# Patient Record
Sex: Male | Born: 1955 | Race: White | Hispanic: No | Marital: Married | Smoking: Former smoker
Health system: Southern US, Community
[De-identification: ages and names within clinical notes are randomized; demographics above are authoritative.]

## PROBLEM LIST (undated history)

## (undated) DIAGNOSIS — R112 Nausea with vomiting, unspecified: Secondary | ICD-10-CM

## (undated) DIAGNOSIS — K219 Gastro-esophageal reflux disease without esophagitis: Secondary | ICD-10-CM

## (undated) DIAGNOSIS — Z9889 Other specified postprocedural states: Secondary | ICD-10-CM

## (undated) DIAGNOSIS — T8859XA Other complications of anesthesia, initial encounter: Secondary | ICD-10-CM

## (undated) DIAGNOSIS — I509 Heart failure, unspecified: Secondary | ICD-10-CM

## (undated) DIAGNOSIS — T4145XA Adverse effect of unspecified anesthetic, initial encounter: Secondary | ICD-10-CM

## (undated) DIAGNOSIS — I10 Essential (primary) hypertension: Secondary | ICD-10-CM

## (undated) HISTORY — PX: ROTATOR CUFF REPAIR: SHX139

## (undated) HISTORY — PX: KNEE SURGERY: SHX244

## (undated) HISTORY — PX: LAMINECTOMY: SHX219

## (undated) HISTORY — PX: CORONARY ANGIOPLASTY: SHX604

## (undated) HISTORY — PX: OTHER SURGICAL HISTORY: SHX169

---
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
## 1993-11-28 HISTORY — PX: CERVICAL DISCECTOMY: SHX98

## 1998-04-29 ENCOUNTER — Encounter: Admission: RE | Admit: 1998-04-29 | Discharge: 1998-07-28 | Payer: Self-pay | Admitting: Anesthesiology

## 1998-06-02 ENCOUNTER — Inpatient Hospital Stay (HOSPITAL_COMMUNITY): Admission: EM | Admit: 1998-06-02 | Discharge: 1998-06-03 | Payer: Self-pay | Admitting: Emergency Medicine

## 1998-11-28 HISTORY — PX: SHOULDER ARTHROSCOPY W/ ROTATOR CUFF REPAIR: SHX2400

## 1999-08-24 ENCOUNTER — Ambulatory Visit (HOSPITAL_COMMUNITY): Admission: RE | Admit: 1999-08-24 | Discharge: 1999-08-24 | Payer: Self-pay | Admitting: Orthopedic Surgery

## 1999-08-24 ENCOUNTER — Encounter: Payer: Self-pay | Admitting: Orthopedic Surgery

## 2000-04-26 ENCOUNTER — Ambulatory Visit (HOSPITAL_COMMUNITY): Admission: RE | Admit: 2000-04-26 | Discharge: 2000-04-26 | Payer: Self-pay | Admitting: Neurosurgery

## 2000-04-26 ENCOUNTER — Encounter: Payer: Self-pay | Admitting: Neurosurgery

## 2000-06-08 ENCOUNTER — Inpatient Hospital Stay (HOSPITAL_COMMUNITY): Admission: RE | Admit: 2000-06-08 | Discharge: 2000-06-09 | Payer: Self-pay | Admitting: Neurosurgery

## 2000-06-08 ENCOUNTER — Encounter: Payer: Self-pay | Admitting: Neurosurgery

## 2000-11-28 HISTORY — PX: LAMINECTOMY: SHX219

## 2003-07-23 ENCOUNTER — Encounter: Payer: Self-pay | Admitting: Geriatric Medicine

## 2003-07-23 ENCOUNTER — Encounter: Admission: RE | Admit: 2003-07-23 | Discharge: 2003-07-23 | Payer: Self-pay | Admitting: Geriatric Medicine

## 2003-09-20 ENCOUNTER — Encounter: Payer: Self-pay | Admitting: Geriatric Medicine

## 2003-09-20 ENCOUNTER — Encounter: Admission: RE | Admit: 2003-09-20 | Discharge: 2003-09-20 | Payer: Self-pay | Admitting: Geriatric Medicine

## 2006-02-25 ENCOUNTER — Encounter: Admission: RE | Admit: 2006-02-25 | Discharge: 2006-02-25 | Payer: Self-pay | Admitting: Neurosurgery

## 2010-03-12 ENCOUNTER — Ambulatory Visit (HOSPITAL_COMMUNITY): Admission: RE | Admit: 2010-03-12 | Discharge: 2010-03-12 | Payer: Self-pay | Admitting: Interventional Cardiology

## 2010-08-16 ENCOUNTER — Encounter
Admission: RE | Admit: 2010-08-16 | Discharge: 2010-09-27 | Payer: Self-pay | Source: Home / Self Care | Admitting: Orthopaedic Surgery

## 2011-02-22 ENCOUNTER — Ambulatory Visit: Payer: No Typology Code available for payment source | Attending: Orthopedic Surgery | Admitting: Physical Therapy

## 2011-02-22 DIAGNOSIS — M25673 Stiffness of unspecified ankle, not elsewhere classified: Secondary | ICD-10-CM | POA: Insufficient documentation

## 2011-02-22 DIAGNOSIS — M25579 Pain in unspecified ankle and joints of unspecified foot: Secondary | ICD-10-CM | POA: Insufficient documentation

## 2011-02-22 DIAGNOSIS — M25676 Stiffness of unspecified foot, not elsewhere classified: Secondary | ICD-10-CM | POA: Insufficient documentation

## 2011-02-22 DIAGNOSIS — IMO0001 Reserved for inherently not codable concepts without codable children: Secondary | ICD-10-CM | POA: Insufficient documentation

## 2011-03-01 ENCOUNTER — Ambulatory Visit: Payer: No Typology Code available for payment source | Attending: Orthopedic Surgery | Admitting: Physical Therapy

## 2011-03-01 DIAGNOSIS — M25673 Stiffness of unspecified ankle, not elsewhere classified: Secondary | ICD-10-CM | POA: Insufficient documentation

## 2011-03-01 DIAGNOSIS — M25579 Pain in unspecified ankle and joints of unspecified foot: Secondary | ICD-10-CM | POA: Insufficient documentation

## 2011-03-01 DIAGNOSIS — IMO0001 Reserved for inherently not codable concepts without codable children: Secondary | ICD-10-CM | POA: Insufficient documentation

## 2011-03-01 DIAGNOSIS — M25676 Stiffness of unspecified foot, not elsewhere classified: Secondary | ICD-10-CM | POA: Insufficient documentation

## 2011-03-03 ENCOUNTER — Ambulatory Visit: Payer: No Typology Code available for payment source | Admitting: Rehabilitation

## 2011-03-07 ENCOUNTER — Ambulatory Visit: Payer: No Typology Code available for payment source | Admitting: Rehabilitation

## 2011-03-09 ENCOUNTER — Ambulatory Visit: Payer: No Typology Code available for payment source | Admitting: Rehabilitation

## 2011-03-15 ENCOUNTER — Ambulatory Visit: Payer: No Typology Code available for payment source | Admitting: Physical Therapy

## 2011-03-17 ENCOUNTER — Ambulatory Visit: Payer: No Typology Code available for payment source | Admitting: Rehabilitation

## 2011-03-21 ENCOUNTER — Ambulatory Visit: Payer: No Typology Code available for payment source | Admitting: Physical Therapy

## 2011-03-23 ENCOUNTER — Ambulatory Visit: Payer: No Typology Code available for payment source | Admitting: Physical Therapy

## 2011-03-28 ENCOUNTER — Ambulatory Visit: Payer: No Typology Code available for payment source | Admitting: Rehabilitation

## 2011-03-31 ENCOUNTER — Encounter: Payer: No Typology Code available for payment source | Admitting: Rehabilitation

## 2011-04-01 ENCOUNTER — Ambulatory Visit: Payer: No Typology Code available for payment source | Attending: Orthopedic Surgery | Admitting: Rehabilitation

## 2011-04-01 DIAGNOSIS — IMO0001 Reserved for inherently not codable concepts without codable children: Secondary | ICD-10-CM | POA: Insufficient documentation

## 2011-04-01 DIAGNOSIS — M25676 Stiffness of unspecified foot, not elsewhere classified: Secondary | ICD-10-CM | POA: Insufficient documentation

## 2011-04-01 DIAGNOSIS — M25579 Pain in unspecified ankle and joints of unspecified foot: Secondary | ICD-10-CM | POA: Insufficient documentation

## 2011-04-01 DIAGNOSIS — M25673 Stiffness of unspecified ankle, not elsewhere classified: Secondary | ICD-10-CM | POA: Insufficient documentation

## 2011-04-12 ENCOUNTER — Ambulatory Visit: Payer: No Typology Code available for payment source | Admitting: Rehabilitation

## 2011-04-14 ENCOUNTER — Ambulatory Visit: Payer: No Typology Code available for payment source | Admitting: Rehabilitation

## 2011-04-15 NOTE — H&P (Signed)
[place]. [facility]  Patient:    Joshua Raymond, Joshua Raymond                       MRN: [id number] Adm. Date:  [id number] Attending:  Barton Fanny CC:         Hewitt Shorts, M.D.                         History and Physical  CHIEF COMPLAINT: The patient is a 55 year old right-handed white male admitted for treatment of right lumbar radiculopathy.  HISTORY OF PRESENT ILLNESS: He has been having a variety of difficulties with his low back over the past several years and in May 1999 he had presented with pain and numbness in his distal anterior left thigh and around his left knee. He was treated by an orthopedist with a series of three epidural steroid injections and the pain improved, but he continues to have numbness in the distal anterior left thigh.  He did well until June 2000 when he re-injured his low back but this time with pain into his low back and into the right buttock.  Subsequently the pain last summer trended down to the right lower extremity to the posterior thigh and lateral leg and he found it painful to sit, lay down, or walk, and he had some pain even extending into the right lower abdomen.  He again tried epidural steroid injection but the first injection caused intensification of his pain and was severely disabling for several days, but he did not pursue any further epidural steroid injections. He was then treated with medications including hydrocodone, Neurontin, and Vioxx.  Surgery was proposed but toward the end of last year the pain eased. However, this past winter and spring, in January and March 2001, he had two recurrences of several radicular pain into the right buttock, posterior thigh, and lateral leg, and because of this recurring pain and discomfort the patient has pursued further evaluation.  He still complains of pain in the right buttock, posterior thigh, and lateral leg.  He finds that he has to sleep in a recliner or in  a fetal position.  He is unable to sleep on his back.  He finds that minimal physical activity can aggravate the pain.  He denies any numbness or paresthesias in the right lower extremity.  He does still have numbness in the distal left anterior thigh.  He does not describe any weakness but he says when he has severe radicular pain the use of the right lower extremity is limited because of the severity of the pain.  At this point he is using Vioxx 25 mg q.d.  Notably, he does has a history of right peroneal nerve injury suffered in a football game.  He had a bad right knee injury associated with the peroneal nerve injury and has residual weakness of his right foot.  He does not feel there is any new weakness in the right foot.  PAST MEDICAL HISTORY:  1. History of hypertension treated for the past ten years or more.  2. History of sleep apnea, for which he has been prescribed a nasal BiPAP     unit but he really does not find it helps and he does not tolerate it     well.  There is no history of myocardial infarction, cancer, stroke, diabetes, peptic ulcer disease, or lung disease.  PAST SURGICAL HISTORY:  1. In  1973 the patient underwent repair of torn ligaments in the right knee     with associated right peroneal nerve injury and a C5-6.  2. Anterior cervical diskectomy and fusion in 1994 by Dr. Danielle Dess.  3. Right rotator cuff repair in 1998.  ALLERGIES: No known drug allergies.  CURRENT MEDICATIONS:  1. Vioxx 25 mg q.d.  2. Hyzaar 50/12.5 mg 1 tablet p.o. q.d. for hypertension.  FAMILY HISTORY: His father died at age 73 of heart attack.  His mother died at age 55 of bone cancer.  There is a family history of hypertension.  SOCIAL HISTORY: The patient is married and works as a [employer]ar[employer] manager; however, he has had to leave his full-time job and is now working on a consulting basis because he has not been able to keep up with the work because of the discomfort and pain  limiting his ability to sit.  He has two daughters. He does not smoke.  He drinks alcoholic beverages socially.  He denies history of substance abuse.  REVIEW OF SYSTEMS: Notable for those findings described in the History of Present Illness and past medical history, but otherwise unremarkable.  PHYSICAL EXAMINATION:  GENERAL: The patient is a well-developed, well-nourished white male in no acute distress.  VITAL SIGNS: Temperature 98.6 degrees, pulse 86, blood pressure 127/80, respiratory rate 18.  Height 6 feet.  Weight 270 pounds.  LUNGS: Clear to auscultation.  He has symmetrical respiratory excursion.  HEART: Regular rate and rhythm, normal S1 and S2 with no murmur.  ABDOMEN: Soft, nondistended.  Bowel sounds present.  EXTREMITIES: No clubbing, cyanosis, or edema.  MUSCULOSKELETAL: No tenderness to palpation over the lumbar spinous process or paralumbar musculature.  He is able to flex 90 degrees and able to extend well.  There is no significant discomfort on flexion or extension.  Straight leg raising is negative on the left and on the right at about 80-90 degrees he gets some discomfort into the right buttock.  NEUROLOGIC: Strength 5/5 to left lower extremity for the iliopsoas, quadriceps, dorsiflexion, plantar flexion, and extensor hallucis longus. However, in the right lower extremity there is significant distal weakness at the right iliopsoas and quadriceps are 5, dorsiflexion is 2/5, the invertor is 3/5, the evertor is 3/5, the extensor hallucis longus is 1/5, and plantar flexion is 4+/5.  Sensory examination shows decreased sensation to pinprick to the distal right lower extremity as compared to the distal left lower extremity.  However, the most significant diminished sensation to pinprick is in the lateral aspirin of the right leg and foot.  Reflexes are 2 at the quadriceps and gastrocnemius and they are symmetric bilaterally.  Toes are downgoing bilaterally.  He  has normal gait and stance.  LABORATORY DATA: MRI scan from May 1999 and August 2000 both done at [facility] were  reviewed and the May 1999 study is notable for degenerative changes at L4-5 and L5-S1, which appeared similar to that in August 2000; however, in May 1999 he had a large disk herniation at the L2-3 level that migrated caudally behind the body of L3 on the left-hand side.  However, there was significant regression in the interval 15 months.  However, significant degenerative changes remain at L4-5 and L5-S1.  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  IMPRESSION: Recurring right lumbar radiculopathy secondary to degenerative disk disease and spondylosis.  PLAN: The patient is being admitted for right L4-5 and right L5-S1 lumbar laminotomy and foraminotomy and possible microdiskectomy.  We have discussed alternatives to surgery, the nature of the surgical procedure, and the likelihood of clinical improvement.  We discussed the nature of the surgical procedure itself, typical length of surgery, hospital stay, and recuperation, with limitations during the postoperative period, and the risks of surgery including the risk of infection, bleeding, possible need for transfusion, risk of nerve dysfunction with pain, weakness, numbness, or paresthesias, the risk of recurrent disk herniation if diskectomy is performed, and anesthetic risks of myocardial infarction,  stroke, pneumonia, and death.  Understanding all this the patient does wish to proceed with surgery.  He does understand that he will need to proceed with physical therapy for physical reconditioning following surgery. DD:  06/08/00 TD:  06/08/00 Job: [id number]

## 2011-04-15 NOTE — Op Note (Signed)
. [facility]  Patient:    Joshua Raymond, Joshua Raymond                       MRN: [id number] Proc. Date: 06/08/00 Adm. Date:  [id number] Attending:  Barton Fanny CC:         Hewitt Shorts, M.D.                           Operative Report  PREOPERATIVE DIAGNOSIS:  Lumbar degenerative disk disease and spondylosis with resulting radiculopathy.  POSTOPERATIVE DIAGNOSIS:  Lumbar degenerative disk disease and spondylosis with resulting radiculopathy.  OPERATION PERFORMED:  Right L4-5 and right L5-S1 lumbar laminotomies and foraminotomies.  SURGEON:  Hewitt Shorts, M.D.  ASSISTANT:  Tanya Nones. Jeral Fruit, M.D.  ANESTHESIA:  General endotracheal.  INDICATIONS FOR PROCEDURE:  The patient is a 55 year old man who presented with right lumbar radiculopathy that was found by MRI scan and myelogram, postmyelogram CT scan to be due to degenerative disk disease and spondylosis with neural compression of the right L5 and S1 nerve roots.  Decision was made to proceed with elective laminotomies and foraminotomies.  DESCRIPTION OF PROCEDURE:  The patient was brought to the operating room and placed under general endotracheal anesthesia.  The patient was turned to a prone position and the lumbar region was prepped with Betadine soap and solutoin and draped in a sterile fashion.  The midline was infiltrated with local anesthetic with epinephrine.  A localizing x-ray was taken.  The L4-5 and L5-S1 level was identified.  A midline incision was made and carried down through the subcutaneous tissues.  Bipolar cautery and electrocautery were used to maintain hemostasis.  Dissection was carried down to the lumbar fascia which was incised in the right side of the midline and the paraspinal muscles were dissected from the spinous process and lamina in subperiosteal fashion. Self-retaining retractor was placed and the L4-5 and L5-S1 interlaminar spaces were identified.   An x-ray was taken to confirm the localization.  Laminotomy was performed at each level using the Midas Rex drill with an AM8 bur and a variety of Kerrison punches.  The microscope was draped and brought into the field to provide additional magnification, illumination and visualization. The remainder of the procedure was performed using microdissection technique. There was significant spondylitic degeneration with calcification of the ligamentum flavum as well as overgrowth of the facet processes.  This spondylitic overgrowth was removed so as to decompress the L5 nerve root and the S1 nerve root.  The disks themselves were examined and clearly were degenerated but there was no disk herniation and therefore no diskectomy was performed.  Once the decompression was completed, the wound was irrigated extensively with bacitracin solution and checked for hemostasis and then 2 cc of fentanyl and 80 mg of Depo-Medrol were infused into the epidural space and then the wound was closed in multiple layers.  The deep fascia was closed with interrupted 0 undyed Vicryl sutures and the subcutaneous and subcuticular layer were closed with interrupted 0 Vicryl and 2-0 undyed Vicryl sutures placed in inverted fashion and the skin edges were approximated with Dermabond.  The patient tolerated the procedure well.  Estimated blood loss was 350 cc.  The sponge and needle counts were correct.  Following surgery, the patient is to be turned to supine position, reversed from anesthetic, activated and transferred to the recovery room for further care. DD:  06/08/00  TD:  06/08/00 Job: [id number]

## 2011-04-19 ENCOUNTER — Ambulatory Visit: Payer: No Typology Code available for payment source | Admitting: Rehabilitation

## 2011-04-21 ENCOUNTER — Ambulatory Visit: Payer: No Typology Code available for payment source | Admitting: Physical Therapy

## 2011-04-26 ENCOUNTER — Ambulatory Visit: Payer: No Typology Code available for payment source | Admitting: Rehabilitation

## 2011-04-28 ENCOUNTER — Ambulatory Visit: Payer: No Typology Code available for payment source | Admitting: Rehabilitation

## 2011-05-03 ENCOUNTER — Ambulatory Visit: Payer: No Typology Code available for payment source | Attending: Orthopedic Surgery | Admitting: Rehabilitation

## 2011-05-03 DIAGNOSIS — M25676 Stiffness of unspecified foot, not elsewhere classified: Secondary | ICD-10-CM | POA: Insufficient documentation

## 2011-05-03 DIAGNOSIS — M25579 Pain in unspecified ankle and joints of unspecified foot: Secondary | ICD-10-CM | POA: Insufficient documentation

## 2011-05-03 DIAGNOSIS — M25673 Stiffness of unspecified ankle, not elsewhere classified: Secondary | ICD-10-CM | POA: Insufficient documentation

## 2011-05-03 DIAGNOSIS — IMO0001 Reserved for inherently not codable concepts without codable children: Secondary | ICD-10-CM | POA: Insufficient documentation

## 2011-05-04 ENCOUNTER — Ambulatory Visit: Payer: No Typology Code available for payment source | Admitting: Rehabilitation

## 2011-05-16 ENCOUNTER — Ambulatory Visit: Payer: No Typology Code available for payment source | Admitting: Rehabilitation

## 2011-05-18 ENCOUNTER — Ambulatory Visit: Payer: No Typology Code available for payment source | Admitting: Rehabilitation

## 2011-11-29 HISTORY — PX: TENDON LENGTHENING: SHX395

## 2015-11-25 ENCOUNTER — Emergency Department (HOSPITAL_BASED_OUTPATIENT_CLINIC_OR_DEPARTMENT_OTHER): Payer: No Typology Code available for payment source

## 2015-11-25 ENCOUNTER — Encounter (HOSPITAL_BASED_OUTPATIENT_CLINIC_OR_DEPARTMENT_OTHER): Payer: Self-pay | Admitting: *Deleted

## 2015-11-25 ENCOUNTER — Emergency Department (HOSPITAL_BASED_OUTPATIENT_CLINIC_OR_DEPARTMENT_OTHER)
Admission: EM | Admit: 2015-11-25 | Discharge: 2015-11-25 | Disposition: A | Discharge status: Home / Self Care | Payer: No Typology Code available for payment source | Attending: Emergency Medicine | Admitting: Emergency Medicine

## 2015-11-25 DIAGNOSIS — Z7982 Long term (current) use of aspirin: Secondary | ICD-10-CM | POA: Diagnosis not present

## 2015-11-25 DIAGNOSIS — Y9289 Other specified places as the place of occurrence of the external cause: Secondary | ICD-10-CM | POA: Insufficient documentation

## 2015-11-25 DIAGNOSIS — W270XXA Contact with workbench tool, initial encounter: Secondary | ICD-10-CM | POA: Insufficient documentation

## 2015-11-25 DIAGNOSIS — S91312A Laceration without foreign body, left foot, initial encounter: Secondary | ICD-10-CM | POA: Diagnosis present

## 2015-11-25 DIAGNOSIS — Y9389 Activity, other specified: Secondary | ICD-10-CM | POA: Insufficient documentation

## 2015-11-25 DIAGNOSIS — Y998 Other external cause status: Secondary | ICD-10-CM | POA: Diagnosis not present

## 2015-11-25 MED ORDER — LIDOCAINE-EPINEPHRINE (PF) 2 %-1:200000 IJ SOLN
10.0000 mL | Freq: Once | INTRAMUSCULAR | Status: AC
  Administered 2015-11-25: 10 mL
  Filled 2015-11-25: qty 10

## 2015-11-25 MED ORDER — CEPHALEXIN 500 MG PO CAPS: 500.0000 mg | ORAL_CAPSULE | Freq: Four times a day (QID) | ORAL | Status: DC

## 2015-11-25 NOTE — ED Notes (Signed)
Pt amb to triage with slow steady gait favoring lle. Pt reports he was splitting wood just pta and the ax slipped hitting the top of his left foot. Pt states "it bled a lot" shoe is in place at triage, pt states he "wrapped it real tight" and put his shoe back on.

## 2015-11-25 NOTE — Discharge Instructions (Signed)
Read the information below.  Use the prescribed medication as directed.  Please discuss all new medications with your pharmacist.  You may return to the Emergency Department at any time for worsening condition or any new symptoms that concern you.    If you develop redness, swelling, pus draining from the wound, or fevers greater than 100.4, return to the ER immediately for a recheck.  If you continue to have slight weakness or decreased range of motion of your toe and it is something you would like to address, please see your orthopedist for evaluation.     Laceration Care, Adult A laceration is a cut that goes through all of the layers of the skin and into the tissue that is right under the skin. Some lacerations heal on their own. Others need to be closed with stitches (sutures), staples, skin adhesive strips, or skin glue. Proper laceration care minimizes the risk of infection and helps the laceration to heal better. HOW TO CARE FOR YOUR LACERATION If sutures or staples were used:  Keep the wound clean and dry.  If you were given a bandage (dressing), you should change it at least one time per day or as told by your health care provider. You should also change it if it becomes wet or dirty.  Keep the wound completely dry for the first 24 hours or as told by your health care provider. After that time, you may shower or bathe. However, make sure that the wound is not soaked in water until after the sutures or staples have been removed.  Clean the wound one time each day or as told by your health care provider:  Wash the wound with soap and water.  Rinse the wound with water to remove all soap.  Pat the wound dry with a clean towel. Do not rub the wound.  After cleaning the wound, apply a thin layer of antibiotic ointmentas told by your health care provider. This will help to prevent infection and keep the dressing from sticking to the wound.  Have the sutures or staples removed as told by  your health care provider. If skin adhesive strips were used:  Keep the wound clean and dry.  If you were given a bandage (dressing), you should change it at least one time per day or as told by your health care provider. You should also change it if it becomes dirty or wet.  Do not get the skin adhesive strips wet. You may shower or bathe, but be careful to keep the wound dry.  If the wound gets wet, pat it dry with a clean towel. Do not rub the wound.  Skin adhesive strips fall off on their own. You may trim the strips as the wound heals. Do not remove skin adhesive strips that are still stuck to the wound. They will fall off in time. If skin glue was used:  Try to keep the wound dry, but you may briefly wet it in the shower or bath. Do not soak the wound in water, such as by swimming.  After you have showered or bathed, gently pat the wound dry with a clean towel. Do not rub the wound.  Do not do any activities that will make you sweat heavily until the skin glue has fallen off on its own.  Do not apply liquid, cream, or ointment medicine to the wound while the skin glue is in place. Using those may loosen the film before the wound has healed.  If you were given a bandage (dressing), you should change it at least one time per day or as told by your health care provider. You should also change it if it becomes dirty or wet.  If a dressing is placed over the wound, be careful not to apply tape directly over the skin glue. Doing that may cause the glue to be pulled off before the wound has healed.  Do not pick at the glue. The skin glue usually remains in place for 5-10 days, then it falls off of the skin. General Instructions  Take over-the-counter and prescription medicines only as told by your health care provider.  If you were prescribed an antibiotic medicine or ointment, take or apply it as told by your doctor. Do not stop using it even if your condition improves.  To help  prevent scarring, make sure to cover your wound with sunscreen whenever you are outside after stitches are removed, after adhesive strips are removed, or when glue remains in place and the wound is healed. Make sure to wear a sunscreen of at least 30 SPF.  Do not scratch or pick at the wound.  Keep all follow-up visits as told by your health care provider. This is important.  Check your wound every day for signs of infection. Watch for:  Redness, swelling, or pain.  Fluid, blood, or pus.  Raise (elevate) the injured area above the level of your heart while you are sitting or lying down, if possible. SEEK MEDICAL CARE IF:  You received a tetanus shot and you have swelling, severe pain, redness, or bleeding at the injection site.  You have a fever.  A wound that was closed breaks open.  You notice a bad smell coming from your wound or your dressing.  You notice something coming out of the wound, such as wood or glass.  Your pain is not controlled with medicine.  You have increased redness, swelling, or pain at the site of your wound.  You have fluid, blood, or pus coming from your wound.  You notice a change in the color of your skin near your wound.  You need to change the dressing frequently due to fluid, blood, or pus draining from the wound.  You develop a new rash.  You develop numbness around the wound. SEEK IMMEDIATE MEDICAL CARE IF:  You develop severe swelling around the wound.  Your pain suddenly increases and is severe.  You develop painful lumps near the wound or on skin that is anywhere on your body.  You have a red streak going away from your wound.  The wound is on your hand or foot and you cannot properly move a finger or toe.  The wound is on your hand or foot and you notice that your fingers or toes look pale or bluish.   This information is not intended to replace advice given to you by your health care provider. Make sure you discuss any questions  you have with your health care provider.   Document Released: 11/14/2005 Document Revised: 03/31/2015 Document Reviewed: 11/10/2014 Elsevier Interactive Patient Education [employer].

## 2015-11-25 NOTE — ED Provider Notes (Signed)
CSN: [id number]     Arrival date & time 11/25/15  1653 History   First MD Initiated Contact with Patient 11/25/15 1802     Chief Complaint  Patient presents with  . Foot Pain     (Consider location/radiation/quality/duration/timing/severity/associated sxs/prior Treatment) HPI   Pt presents with pain in his left foot after accidentally hitting and cutting it with an ax while chopping wood.  States the ax glanced off the wood and landed on his foot, going through his shoe.  States it bled and was numb around the laceration initially but the feeling is coming back.  He is having pain around the area where he was hit, over the distal 2nd metatarsal.   Denies other injuries.  Had tetanus vx within the past 2 years.  Has been taking aspirin recently for shoulder pain, no other blood thinners.   History reviewed. No pertinent past medical history. History reviewed. No pertinent past surgical history. History reviewed. No pertinent family history. Social History  Substance Use Topics  . Smoking status: Never Smoker   . Smokeless tobacco: None  . Alcohol Use: None    Review of Systems  Constitutional: Negative for fever.  Cardiovascular: Negative for leg swelling.  Musculoskeletal: Positive for arthralgias.  Skin: Positive for wound.  Allergic/Immunologic: Negative for immunocompromised state.  Neurological: Negative for weakness and numbness.  Hematological: Bruises/bleeds easily (taking aspirin recently).  Psychiatric/Behavioral: Positive for self-injury (accidental).      Allergies  Review of patient's allergies indicates no known allergies.  Home Medications   Prior to Admission medications   Not on File   BP 144/83 mmHg  Pulse 98  Temp(Src) 98.6 F (37 C) (Oral)  Resp 18  Ht 6' (1.829 m)  Wt 108.863 kg  BMI 32.54 kg/m2  SpO2 99% Physical Exam  Constitutional: He appears well-developed and well-nourished. No distress.  HENT:  Head: Normocephalic and atraumatic.   Neck: Neck supple.  Pulmonary/Chest: Effort normal.  Musculoskeletal:  Left foot with laceration overlying second metatarsal, dorsal aspect, hemostatic.  Associated tenderness.  Moves all toes, sensation intact.  Capillary refill < 3 seconds. No other wounds noted.  Extensor tendon partially visualized without visible injury though pt does have slight decrease in strength extending second toe.    Neurological: He is alert.  Skin: He is not diaphoretic.  Nursing note and vitals reviewed.   ED Course  Procedures (including critical care time) Labs Review Labs Reviewed - No data to display  Imaging Review Dg Foot Complete Left  11/25/2015  CLINICAL DATA:  Left foot pain, hit with attacks today. Laceration between second and third metatarsal heads. EXAM: LEFT FOOT - COMPLETE 3+ VIEW COMPARISON:  None. FINDINGS: No acute bony abnormality. No visible fracture. No subluxation or dislocation. No radiopaque foreign bodies. IMPRESSION: No acute findings. Electronically Signed   By: Rolm Baptise M.D.   On: 11/25/2015 19:07   I have personally reviewed and evaluated these images and lab results as part of my medical decision-making.   EKG Interpretation None       LACERATION REPAIR Performed by: Clayton Bibles Authorized by: Clayton Bibles Consent: Verbal consent obtained. Risks and benefits: risks, benefits and alternatives were discussed Consent given by: patient Patient identity confirmed: provided demographic data Prepped and Draped in normal sterile fashion Wound explored  Laceration Location: left foot  Laceration Length: 2cm  No Foreign Bodies seen or palpated  Anesthesia: local infiltration  Local anesthetic: lidocaine 2% with epinephrine  Anesthetic total: 4 ml  Irrigation method: syringe Amount of cleaning: standard  Skin closure: 4-0 vicryl  Number of sutures: 4  Technique: simple interrupted  Patient tolerance: Patient tolerated the procedure well with no  immediate complications.   MDM   Final diagnoses:  Foot laceration, left, initial encounter    Afebrile, nontoxic patient with injury to his left foot while chopping wood.  Tetanus vx up to date.   Xray negative.  Laceration repaired in ED.  Possibility of extensor tendon partial laceration but not visible- discussed this with patient who is not concerned about the minimal change in his functionality and is not interested in having this repaired.  He has an orthopedist, does not remember name currently.   D/C home with keflex to prevent infection, home wound care, return precautions. Absorbable sutures.   Discussed result, findings, treatment, and follow up  with patient.  Pt given return precautions.  Pt verbalizes understanding and agrees with plan.        Clayton Bibles, PA-C 11/25/15 1939  Quintella Reichert, MD 11/26/15 0111

## 2016-08-15 ENCOUNTER — Other Ambulatory Visit: Payer: Self-pay | Admitting: Geriatric Medicine

## 2016-08-15 DIAGNOSIS — R110 Nausea: Secondary | ICD-10-CM

## 2016-08-17 ENCOUNTER — Inpatient Hospital Stay: Admission: RE | Admit: 2016-08-17 | Payer: No Typology Code available for payment source | Source: Ambulatory Visit

## 2016-08-18 ENCOUNTER — Ambulatory Visit
Admission: RE | Admit: 2016-08-18 | Discharge: 2016-08-18 | Disposition: A | Discharge status: Home / Self Care | Payer: No Typology Code available for payment source | Source: Ambulatory Visit | Attending: Geriatric Medicine | Admitting: Geriatric Medicine

## 2016-08-18 DIAGNOSIS — R110 Nausea: Secondary | ICD-10-CM

## 2016-08-19 ENCOUNTER — Other Ambulatory Visit: Payer: Self-pay | Admitting: Geriatric Medicine

## 2016-08-19 DIAGNOSIS — R1012 Left upper quadrant pain: Secondary | ICD-10-CM

## 2016-08-26 ENCOUNTER — Other Ambulatory Visit: Payer: No Typology Code available for payment source

## 2016-09-08 ENCOUNTER — Ambulatory Visit
Admission: RE | Admit: 2016-09-08 | Discharge: 2016-09-08 | Disposition: A | Discharge status: Home / Self Care | Payer: No Typology Code available for payment source | Source: Ambulatory Visit | Attending: Geriatric Medicine | Admitting: Geriatric Medicine

## 2016-09-08 DIAGNOSIS — R1012 Left upper quadrant pain: Secondary | ICD-10-CM

## 2016-10-03 ENCOUNTER — Other Ambulatory Visit: Payer: Self-pay | Admitting: Gastroenterology

## 2016-11-01 ENCOUNTER — Encounter (HOSPITAL_COMMUNITY): Payer: Self-pay

## 2016-11-08 ENCOUNTER — Ambulatory Visit (HOSPITAL_COMMUNITY): Payer: No Typology Code available for payment source | Admitting: Certified Registered Nurse Anesthetist

## 2016-11-08 ENCOUNTER — Encounter (HOSPITAL_COMMUNITY)
Admission: RE | Disposition: A | Discharge status: Home / Self Care | Payer: Self-pay | Source: Ambulatory Visit | Attending: Gastroenterology

## 2016-11-08 ENCOUNTER — Ambulatory Visit (HOSPITAL_COMMUNITY)
Admission: RE | Admit: 2016-11-08 | Discharge: 2016-11-08 | Disposition: A | Discharge status: Home / Self Care | Payer: No Typology Code available for payment source | Source: Ambulatory Visit | Attending: Gastroenterology | Admitting: Gastroenterology

## 2016-11-08 DIAGNOSIS — Z1211 Encounter for screening for malignant neoplasm of colon: Secondary | ICD-10-CM | POA: Diagnosis present

## 2016-11-08 DIAGNOSIS — Z86010 Personal history of colonic polyps: Secondary | ICD-10-CM | POA: Diagnosis not present

## 2016-11-08 DIAGNOSIS — Z79899 Other long term (current) drug therapy: Secondary | ICD-10-CM | POA: Diagnosis not present

## 2016-11-08 DIAGNOSIS — K2960 Other gastritis without bleeding: Secondary | ICD-10-CM | POA: Insufficient documentation

## 2016-11-08 DIAGNOSIS — D123 Benign neoplasm of transverse colon: Secondary | ICD-10-CM | POA: Diagnosis not present

## 2016-11-08 DIAGNOSIS — K30 Functional dyspepsia: Secondary | ICD-10-CM | POA: Diagnosis not present

## 2016-11-08 DIAGNOSIS — Z85828 Personal history of other malignant neoplasm of skin: Secondary | ICD-10-CM | POA: Insufficient documentation

## 2016-11-08 DIAGNOSIS — D122 Benign neoplasm of ascending colon: Secondary | ICD-10-CM | POA: Diagnosis not present

## 2016-11-08 DIAGNOSIS — K219 Gastro-esophageal reflux disease without esophagitis: Secondary | ICD-10-CM | POA: Diagnosis not present

## 2016-11-08 DIAGNOSIS — Z87891 Personal history of nicotine dependence: Secondary | ICD-10-CM | POA: Insufficient documentation

## 2016-11-08 DIAGNOSIS — I10 Essential (primary) hypertension: Secondary | ICD-10-CM | POA: Insufficient documentation

## 2016-11-08 HISTORY — DX: Adverse effect of unspecified anesthetic, initial encounter: T41.45XA

## 2016-11-08 HISTORY — DX: Essential (primary) hypertension: I10

## 2016-11-08 HISTORY — PX: ESOPHAGOGASTRODUODENOSCOPY (EGD) WITH PROPOFOL: SHX5813

## 2016-11-08 HISTORY — DX: Other complications of anesthesia, initial encounter: T88.59XA

## 2016-11-08 HISTORY — DX: Other specified postprocedural states: R11.2

## 2016-11-08 HISTORY — PX: COLONOSCOPY WITH PROPOFOL: SHX5780

## 2016-11-08 HISTORY — DX: Gastro-esophageal reflux disease without esophagitis: K21.9

## 2016-11-08 HISTORY — DX: Other specified postprocedural states: Z98.890

## 2016-11-08 SURGERY — ESOPHAGOGASTRODUODENOSCOPY (EGD) WITH PROPOFOL
Anesthesia: Monitor Anesthesia Care

## 2016-11-08 MED ORDER — LIDOCAINE 2% (20 MG/ML) 5 ML SYRINGE
INTRAMUSCULAR | Status: AC
  Filled 2016-11-08: qty 5

## 2016-11-08 MED ORDER — SODIUM CHLORIDE 0.9 % IV SOLN: INTRAVENOUS | Status: DC

## 2016-11-08 MED ORDER — LACTATED RINGERS IV SOLN
INTRAVENOUS | Status: DC
  Administered 2016-11-08: 10:00:00 via INTRAVENOUS

## 2016-11-08 MED ORDER — PROPOFOL 500 MG/50ML IV EMUL
INTRAVENOUS | Status: DC | PRN
  Administered 2016-11-08: 150 ug/kg/min via INTRAVENOUS

## 2016-11-08 MED ORDER — LIDOCAINE 2% (20 MG/ML) 5 ML SYRINGE
INTRAMUSCULAR | Status: DC | PRN
  Administered 2016-11-08: 100 mg via INTRAVENOUS

## 2016-11-08 MED ORDER — ONDANSETRON HCL 4 MG/2ML IJ SOLN
INTRAMUSCULAR | Status: AC
  Filled 2016-11-08: qty 2

## 2016-11-08 MED ORDER — PROPOFOL 10 MG/ML IV BOLUS
INTRAVENOUS | Status: AC
  Filled 2016-11-08: qty 20

## 2016-11-08 MED ORDER — ONDANSETRON HCL 4 MG/2ML IJ SOLN
INTRAMUSCULAR | Status: DC | PRN
  Administered 2016-11-08: 4 mg via INTRAVENOUS

## 2016-11-08 MED ORDER — PROPOFOL 10 MG/ML IV BOLUS
INTRAVENOUS | Status: DC | PRN
  Administered 2016-11-08: 20 mg via INTRAVENOUS

## 2016-11-08 MED ORDER — PROPOFOL 10 MG/ML IV BOLUS
INTRAVENOUS | Status: AC
  Filled 2016-11-08: qty 40

## 2016-11-08 MED ORDER — LACTATED RINGERS IV SOLN
INTRAVENOUS | Status: DC
  Administered 2016-11-08: 1000 mL via INTRAVENOUS

## 2016-11-08 SURGICAL SUPPLY — 25 items

## 2016-11-08 NOTE — Anesthesia Preprocedure Evaluation (Signed)
Anesthesia Evaluation  Patient identified by MRN, date of birth, ID band Patient awake    Reviewed: Allergy & Precautions, NPO status , Patient's Chart, lab work & pertinent test results  History of Anesthesia Complications (+) PONV  Airway Mallampati: II  TM Distance: >3 FB Neck ROM: Full    Dental no notable dental hx.    Pulmonary neg pulmonary ROS, former smoker,    Pulmonary exam normal breath sounds clear to auscultation       Cardiovascular hypertension, negative cardio ROS Normal cardiovascular exam Rhythm:Regular Rate:Normal     Neuro/Psych negative neurological ROS  negative psych ROS   GI/Hepatic negative GI ROS, Neg liver ROS,   Endo/Other  negative endocrine ROS  Renal/GU negative Renal ROS  negative genitourinary   Musculoskeletal negative musculoskeletal ROS (+)   Abdominal   Peds negative pediatric ROS (+)  Hematology negative hematology ROS (+)   Anesthesia Other Findings   Reproductive/Obstetrics negative OB ROS                             Anesthesia Physical Anesthesia Plan  ASA: II  Anesthesia Plan: MAC   Post-op Pain Management:    Induction:   Airway Management Planned: Nasal Cannula  Additional Equipment:   Intra-op Plan:   Post-operative Plan: Extubation in OR  Informed Consent: I have reviewed the patients History and Physical, chart, labs and discussed the procedure including the risks, benefits and alternatives for the proposed anesthesia with the patient or authorized representative who has indicated his/her understanding and acceptance.   Dental advisory given  Plan Discussed with: CRNA  Anesthesia Plan Comments:         Anesthesia Quick Evaluation

## 2016-11-08 NOTE — Op Note (Signed)
[facility] Patient Name: Joshua Raymond Procedure Date: 11/08/2016 MRN: [id number] Attending MD: Garlan Fair , MD Date of Birth: 1956-02-14 CSN: [id number] Age: 60 Admit Type: Outpatient Procedure:                Colonoscopy Indications:              High risk colon cancer surveillance: Personal                            history of non-advanced adenoma Providers:                Garlan Fair, MD, Dustin Flock RN, RN, Corliss Parish, Technician Referring MD:              Medicines:                Propofol per Anesthesia Complications:            No immediate complications. Estimated Blood Loss:     Estimated blood loss: none. Procedure:                Pre-Anesthesia Assessment:                           - Prior to the procedure, a History and Physical                            was performed, and patient medications and                            allergies were reviewed. The patient's tolerance of                            previous anesthesia was also reviewed. The risks                            and benefits of the procedure and the sedation                            options and risks were discussed with the patient.                            All questions were answered, and informed consent                            was obtained. Prior Anticoagulants: The patient has                            taken no previous anticoagulant or antiplatelet                            agents. ASA Grade Assessment: II - A patient with  mild systemic disease. After reviewing the risks                            and benefits, the patient was deemed in                            satisfactory condition to undergo the procedure.                           After obtaining informed consent, the colonoscope                            was passed under direct vision. Throughout the                            procedure, the  patient's blood pressure, pulse, and                            oxygen saturations were monitored continuously. The                            [id number] scope was introduced through                            the anus and advanced to the the cecum, identified                            by appendiceal orifice and ileocecal valve. The                            colonoscopy was performed without difficulty. The                            patient tolerated the procedure well. The quality                            of the bowel preparation was good. The ileocecal                            valve, the appendiceal orifice and the rectum were                            photographed. Scope In: 10:35:34 AM Scope Out: 11:58:00 AM Scope Withdrawal Time: 1 hour 13 minutes 6 seconds  Total Procedure Duration: 1 hour 22 minutes 26 seconds  Findings:      The perianal and digital rectal examinations were normal.      Two sessile polyps were found in the ascending colon. The polyps were 5       mm in size. These polyps were removed with a cold snare. Resection and       retrieval were complete.      A 5 mm polyp was found in the transverse colon. The polyp was sessile.       The polyp was removed with a cold snare. Resection and retrieval  were       complete.      The exam was otherwise without abnormality. Impression:               - Two 5 mm polyps in the ascending colon, removed                            with a cold snare. Resected and retrieved.                           - One 5 mm polyp in the transverse colon, removed                            with a cold snare. Resected and retrieved.                           - The examination was otherwise normal. Moderate Sedation:      N/A- Per Anesthesia Care Recommendation:           - Patient has a contact number available for                            emergencies. The signs and symptoms of potential                            delayed  complications were discussed with the                            patient. Return to normal activities tomorrow.                            Written discharge instructions were provided to the                            patient.                           - Repeat colonoscopy in 5 years for surveillance.                           - Resume previous diet.                           - Continue present medications. Procedure Code(s):        --- Professional ---                           [phone], Colonoscopy, flexible; with removal of                            tumor(s), polyp(s), or other lesion(s) by snare                            technique Diagnosis Code(s):        --- Professional ---  Z86.010, Personal history of colonic polyps                           D12.2, Benign neoplasm of ascending colon                           D12.3, Benign neoplasm of transverse colon (hepatic                            flexure or splenic flexure) CPT copyright 2016 [employer]. All rights reserved. The codes documented in this report are preliminary and upon coder review may  be revised to meet current compliance requirements. Earle Gell, MD Garlan Fair, MD 11/08/2016 11:07:36 AM This report has been signed electronically. Number of Addenda: 0

## 2016-11-08 NOTE — Anesthesia Procedure Notes (Signed)
Procedure Name: MAC Date/Time: 11/08/2016 10:25 AM Performed by: West Pugh Pre-anesthesia Checklist: Patient identified, Timeout performed, Emergency Drugs available, Suction available and Patient being monitored Patient Re-evaluated:Patient Re-evaluated prior to inductionOxygen Delivery Method: Nasal cannula Placement Confirmation: positive ETCO2 Dental Injury: Teeth and Oropharynx as per pre-operative assessment

## 2016-11-08 NOTE — Discharge Instructions (Signed)

## 2016-11-08 NOTE — Op Note (Signed)
[facility] ([facility]) Patient Name: Joshua Raymond Procedure Date: 11/08/2016 MRN: [id number] Attending MD: Garlan Fair , MD Date of Birth: 28-Jan-1956 CSN: [id number] Age: 60 Admit Type: Outpatient Procedure:                Upper GI endoscopy Indications:              Indigestion Providers:                Garlan Fair, MD, Dustin Flock RN, RN, Corliss Parish, Technician Referring MD:              Medicines:                Propofol per Anesthesia Complications:            No immediate complications. Estimated Blood Loss:     Estimated blood loss: none. Procedure:                Pre-Anesthesia Assessment:                           - Prior to the procedure, a History and Physical                            was performed, and patient medications and                            allergies were reviewed. The patient's tolerance of                            previous anesthesia was also reviewed. The risks                            and benefits of the procedure and the sedation                            options and risks were discussed with the patient.                            All questions were answered, and informed consent                            was obtained. Prior Anticoagulants: The patient has                            taken no previous anticoagulant or antiplatelet                            agents. ASA Grade Assessment: II - A patient with                            mild systemic disease. After reviewing the risks  and benefits, the patient was deemed in                            satisfactory condition to undergo the procedure.                           After obtaining informed consent, the endoscope was                            passed under direct vision. Throughout the                            procedure, the patient's blood pressure, pulse, and                            oxygen saturations were  monitored continuously. The                            Endoscope was introduced through the mouth, and                            advanced to the second part of duodenum. The upper                            GI endoscopy was accomplished without difficulty.                            The patient tolerated the procedure well. Scope In: Scope Out: Findings:      The Z-line was regular and was found 40 cm from the incisors.      The examined esophagus was normal.      The entire examined stomach was normal. Biopsies were taken with a cold       forceps for Helicobacter pylori testing.      The examined duodenum was normal. Impression:               - Z-line regular, 40 cm from the incisors.                           - Normal esophagus.                           - Normal stomach. Biopsied.                           - Normal examined duodenum. Moderate Sedation:      N/A- Per Anesthesia Care Recommendation:           - Patient has a contact number available for                            emergencies. The signs and symptoms of potential                            delayed complications were discussed with the  patient. Return to normal activities tomorrow.                            Written discharge instructions were provided to the                            patient.                           - Await pathology results.                           - Resume previous diet.                           - Continue present medications. Procedure Code(s):        --- Professional ---                           [phone], Esophagogastroduodenoscopy, flexible,                            transoral; with biopsy, single or multiple Diagnosis Code(s):        --- Professional ---                           K30, Functional dyspepsia CPT copyright 2016 American Medical Association. All rights reserved. The codes documented in this report are preliminary and upon coder review may  be  revised to meet current compliance requirements. Earle Gell, MD Garlan Fair, MD 11/08/2016 11:10:31 AM This report has been signed electronically. Number of Addenda: 0

## 2016-11-08 NOTE — Anesthesia Postprocedure Evaluation (Signed)
Anesthesia Post Note  Patient: Joshua Raymond  Procedure(s) Performed: Procedure(s) (LRB): ESOPHAGOGASTRODUODENOSCOPY (EGD) WITH PROPOFOL (N/A) COLONOSCOPY WITH PROPOFOL (N/A)  Patient location during evaluation: Endoscopy Anesthesia Type: MAC Level of consciousness: awake and alert Pain management: pain level controlled Vital Signs Assessment: post-procedure vital signs reviewed and stable Respiratory status: spontaneous breathing, nonlabored ventilation, respiratory function stable and patient connected to nasal cannula oxygen Cardiovascular status: stable and blood pressure returned to baseline Anesthetic complications: no    Last Vitals:  Vitals:   11/08/16 1135 11/08/16 1136  BP:  126/74  Pulse: 75 73  Resp: 17 18  Temp:      Last Pain:  Vitals:   11/08/16 1114  TempSrc: Oral                 [name]

## 2016-11-08 NOTE — Transfer of Care (Signed)
Immediate Anesthesia Transfer of Care Note  Patient: Joshua Raymond  Procedure(s) Performed: Procedure(s): ESOPHAGOGASTRODUODENOSCOPY (EGD) WITH PROPOFOL (N/A) COLONOSCOPY WITH PROPOFOL (N/A)  Patient Location: PACU  Anesthesia Type:MAC  Level of Consciousness:  sedated, patient cooperative and responds to stimulation  Airway & Oxygen Therapy:Patient Spontanous Breathing and Patient connected to face mask oxgen  Post-op Assessment:  Report given to PACU RN and Post -op Vital signs reviewed and stable  Post vital signs:  Reviewed and stable  Last Vitals:  Vitals:   11/08/16 0950  BP: (!) 161/99  Pulse: 87  Resp: 17  Temp: [id number] C    Complications: No apparent anesthesia complications

## 2016-11-08 NOTE — H&P (Signed)
Problem: Epigastric pain on aspirin and ibuprofen. 09/08/2016 normal abdominal ultrasound was performed. A 08/18/2016 normal barium upper GI x-ray series was performed. 11/15/2011 normal surveillance colonoscopy was performed. Perform repeat surveillance colonoscopy in December 2017. History of adenomatous colon polyps removed colonoscopically in the past.  History: The patient is a 60 year old male born 04/13/1956. He is scheduled to undergo a surveillance colonoscopy today.  When the patient developed burning epigastric discomfort with nausea, he was prescribed Nexium. His barium upper GI x-ray series and abdominal ultrasound were normal. He was instructed to stop taking aspirin and ibuprofen. He continues to have intermittent epigastric discomfort with nausea but no vomiting.  He is scheduled to undergo diagnostic esophagogastroduodenoscopy with screen for H. pylori gastritis followed surveillance colonoscopy today.  Past medical history: Right rotator cuff repair. Left ankle surgery. Laminectomy. Cervical discectomy. Right knee surgery. Hypertension. Obstructive sleep apnea. Gastroesophageal reflux. Hypercholesterolemia. Basal cell skin cancers.  Exam: The patient is alert and lying comfortably on the endoscopy stretcher. Abdomen is soft and nontender to palpation. Cardiac exam reveals a regular rhythm. Lungs clear to auscultation.  Plan: Proceed with diagnostic esophagogastroduodenoscopy with screen for H. pylori gastritis followed by surveillance colonoscopy

## 2016-11-10 ENCOUNTER — Encounter (HOSPITAL_COMMUNITY): Payer: Self-pay | Admitting: Gastroenterology

## 2017-02-07 DIAGNOSIS — H9191 Unspecified hearing loss, right ear: Secondary | ICD-10-CM | POA: Insufficient documentation

## 2017-02-14 ENCOUNTER — Other Ambulatory Visit: Payer: Self-pay | Admitting: Geriatric Medicine

## 2017-02-14 DIAGNOSIS — R109 Unspecified abdominal pain: Secondary | ICD-10-CM

## 2017-02-20 ENCOUNTER — Ambulatory Visit
Admission: RE | Admit: 2017-02-20 | Discharge: 2017-02-20 | Disposition: A | Discharge status: Home / Self Care | Payer: No Typology Code available for payment source | Source: Ambulatory Visit | Attending: Geriatric Medicine | Admitting: Geriatric Medicine

## 2017-02-20 DIAGNOSIS — R109 Unspecified abdominal pain: Secondary | ICD-10-CM

## 2017-02-20 MED ORDER — IOPAMIDOL (ISOVUE-300) INJECTION 61%
125.0000 mL | Freq: Once | INTRAVENOUS | Status: AC | PRN
  Administered 2017-02-20: 125 mL via INTRAVENOUS

## 2017-03-15 ENCOUNTER — Ambulatory Visit
Admission: RE | Admit: 2017-03-15 | Discharge: 2017-03-15 | Disposition: A | Discharge status: Home / Self Care | Payer: No Typology Code available for payment source | Source: Ambulatory Visit | Attending: Geriatric Medicine | Admitting: Geriatric Medicine

## 2017-03-15 ENCOUNTER — Other Ambulatory Visit: Payer: Self-pay | Admitting: Geriatric Medicine

## 2017-03-15 DIAGNOSIS — R05 Cough: Secondary | ICD-10-CM

## 2017-03-15 DIAGNOSIS — R059 Cough, unspecified: Secondary | ICD-10-CM

## 2017-03-21 ENCOUNTER — Ambulatory Visit
Admission: RE | Admit: 2017-03-21 | Discharge: 2017-03-21 | Disposition: A | Discharge status: Home / Self Care | Payer: No Typology Code available for payment source | Source: Ambulatory Visit | Attending: Geriatric Medicine | Admitting: Geriatric Medicine

## 2017-03-21 ENCOUNTER — Other Ambulatory Visit: Payer: Self-pay | Admitting: Geriatric Medicine

## 2017-03-21 DIAGNOSIS — R1084 Generalized abdominal pain: Secondary | ICD-10-CM

## 2017-03-22 ENCOUNTER — Inpatient Hospital Stay (HOSPITAL_BASED_OUTPATIENT_CLINIC_OR_DEPARTMENT_OTHER)
Admission: EM | Admit: 2017-03-22 | Discharge: 2017-03-27 | DRG: 286 | Disposition: A | Discharge status: Home / Self Care | Payer: No Typology Code available for payment source | Attending: Cardiovascular Disease | Admitting: Cardiovascular Disease

## 2017-03-22 ENCOUNTER — Emergency Department (HOSPITAL_BASED_OUTPATIENT_CLINIC_OR_DEPARTMENT_OTHER): Payer: No Typology Code available for payment source

## 2017-03-22 ENCOUNTER — Encounter (HOSPITAL_BASED_OUTPATIENT_CLINIC_OR_DEPARTMENT_OTHER): Payer: Self-pay | Admitting: Emergency Medicine

## 2017-03-22 ENCOUNTER — Other Ambulatory Visit (HOSPITAL_COMMUNITY): Payer: Self-pay | Admitting: Geriatric Medicine

## 2017-03-22 DIAGNOSIS — I248 Other forms of acute ischemic heart disease: Secondary | ICD-10-CM | POA: Diagnosis present

## 2017-03-22 DIAGNOSIS — Z7982 Long term (current) use of aspirin: Secondary | ICD-10-CM | POA: Diagnosis not present

## 2017-03-22 DIAGNOSIS — Z79899 Other long term (current) drug therapy: Secondary | ICD-10-CM

## 2017-03-22 DIAGNOSIS — R7989 Other specified abnormal findings of blood chemistry: Secondary | ICD-10-CM

## 2017-03-22 DIAGNOSIS — N189 Chronic kidney disease, unspecified: Secondary | ICD-10-CM | POA: Diagnosis present

## 2017-03-22 DIAGNOSIS — Z7951 Long term (current) use of inhaled steroids: Secondary | ICD-10-CM

## 2017-03-22 DIAGNOSIS — I429 Cardiomyopathy, unspecified: Secondary | ICD-10-CM | POA: Diagnosis not present

## 2017-03-22 DIAGNOSIS — Z87891 Personal history of nicotine dependence: Secondary | ICD-10-CM | POA: Diagnosis not present

## 2017-03-22 DIAGNOSIS — R1084 Generalized abdominal pain: Secondary | ICD-10-CM

## 2017-03-22 DIAGNOSIS — Z6834 Body mass index (BMI) 34.0-34.9, adult: Secondary | ICD-10-CM

## 2017-03-22 DIAGNOSIS — I272 Pulmonary hypertension, unspecified: Secondary | ICD-10-CM | POA: Diagnosis present

## 2017-03-22 DIAGNOSIS — R748 Abnormal levels of other serum enzymes: Secondary | ICD-10-CM

## 2017-03-22 DIAGNOSIS — I5043 Acute on chronic combined systolic (congestive) and diastolic (congestive) heart failure: Secondary | ICD-10-CM | POA: Diagnosis present

## 2017-03-22 DIAGNOSIS — E876 Hypokalemia: Secondary | ICD-10-CM | POA: Diagnosis not present

## 2017-03-22 DIAGNOSIS — E669 Obesity, unspecified: Secondary | ICD-10-CM | POA: Diagnosis present

## 2017-03-22 DIAGNOSIS — N179 Acute kidney failure, unspecified: Secondary | ICD-10-CM

## 2017-03-22 DIAGNOSIS — R079 Chest pain, unspecified: Secondary | ICD-10-CM | POA: Diagnosis present

## 2017-03-22 DIAGNOSIS — I214 Non-ST elevation (NSTEMI) myocardial infarction: Secondary | ICD-10-CM

## 2017-03-22 DIAGNOSIS — I428 Other cardiomyopathies: Secondary | ICD-10-CM | POA: Diagnosis present

## 2017-03-22 DIAGNOSIS — I509 Heart failure, unspecified: Secondary | ICD-10-CM | POA: Diagnosis not present

## 2017-03-22 DIAGNOSIS — I5021 Acute systolic (congestive) heart failure: Secondary | ICD-10-CM | POA: Diagnosis not present

## 2017-03-22 DIAGNOSIS — E871 Hypo-osmolality and hyponatremia: Secondary | ICD-10-CM | POA: Diagnosis present

## 2017-03-22 DIAGNOSIS — K219 Gastro-esophageal reflux disease without esophagitis: Secondary | ICD-10-CM | POA: Diagnosis present

## 2017-03-22 DIAGNOSIS — R778 Other specified abnormalities of plasma proteins: Secondary | ICD-10-CM

## 2017-03-22 DIAGNOSIS — I130 Hypertensive heart and chronic kidney disease with heart failure and stage 1 through stage 4 chronic kidney disease, or unspecified chronic kidney disease: Secondary | ICD-10-CM | POA: Diagnosis present

## 2017-03-22 DIAGNOSIS — I5023 Acute on chronic systolic (congestive) heart failure: Secondary | ICD-10-CM | POA: Diagnosis not present

## 2017-03-22 DIAGNOSIS — R140 Abdominal distension (gaseous): Secondary | ICD-10-CM

## 2017-03-22 HISTORY — DX: Heart failure, unspecified: I50.9

## 2017-03-22 LAB — CBC WITH DIFFERENTIAL/PLATELET
Basophils Absolute: 0 10*3/uL (ref 0.0–0.1)
Basophils Relative: 0 %
Eosinophils Absolute: 0 10*3/uL (ref 0.0–0.7)
Eosinophils Relative: 0 %
HEMATOCRIT: 39.3 % (ref 39.0–52.0)
HEMOGLOBIN: 13.4 g/dL (ref 13.0–17.0)
LYMPHS ABS: 0.9 10*3/uL (ref 0.7–4.0)
LYMPHS PCT: 8 %
MCH: 30.3 pg (ref 26.0–34.0)
MCHC: 34.1 g/dL (ref 30.0–36.0)
MCV: 88.9 fL (ref 78.0–100.0)
Monocytes Absolute: 1.5 10*3/uL — ABNORMAL HIGH (ref 0.1–1.0)
Monocytes Relative: 12 %
NEUTROS PCT: 80 %
Neutro Abs: 9.6 10*3/uL — ABNORMAL HIGH (ref 1.7–7.7)
Platelets: 187 10*3/uL (ref 150–400)
RBC: 4.42 MIL/uL (ref 4.22–5.81)
RDW: 12.8 % (ref 11.5–15.5)
WBC: 12 10*3/uL — AB (ref 4.0–10.5)

## 2017-03-22 LAB — BASIC METABOLIC PANEL
ANION GAP: 12 (ref 5–15)
BUN: 17 mg/dL (ref 6–20)
CALCIUM: 8.5 mg/dL — AB (ref 8.9–10.3)
CO2: 24 mmol/L (ref 22–32)
Chloride: 95 mmol/L — ABNORMAL LOW (ref 101–111)
Creatinine, Ser: 1.39 mg/dL — ABNORMAL HIGH (ref 0.61–1.24)
GFR calc Af Amer: >60 mL/min (ref >60)
GFR calc non Af Amer: 53 mL/min — ABNORMAL LOW (ref >60)
GLUCOSE: 132 mg/dL — AB (ref 65–99)
Potassium: 3.6 mmol/L (ref 3.5–5.1)
Sodium: 131 mmol/L — ABNORMAL LOW (ref 135–145)

## 2017-03-22 LAB — TROPONIN I: Troponin I: 1.71 ng/mL (ref <0.03)

## 2017-03-22 LAB — BRAIN NATRIURETIC PEPTIDE: B NATRIURETIC PEPTIDE 5: 1213.8 pg/mL — AB (ref 0.0–100.0)

## 2017-03-22 MED ORDER — HEPARIN (PORCINE) IN NACL 100-0.45 UNIT/ML-% IJ SOLN
12.0000 [IU]/kg/h | Freq: Once | INTRAMUSCULAR | Status: AC
  Administered 2017-03-22: 12 [IU]/kg/h via INTRAVENOUS
  Filled 2017-03-22: qty 250

## 2017-03-22 MED ORDER — FUROSEMIDE 10 MG/ML IJ SOLN
40.0000 mg | Freq: Once | INTRAMUSCULAR | Status: AC
  Administered 2017-03-22: 40 mg via INTRAVENOUS
  Filled 2017-03-22: qty 4

## 2017-03-22 MED ORDER — ONDANSETRON HCL 4 MG/2ML IJ SOLN
4.0000 mg | Freq: Once | INTRAMUSCULAR | Status: AC
  Administered 2017-03-22: 4 mg via INTRAVENOUS
  Filled 2017-03-22: qty 2

## 2017-03-22 MED ORDER — HEPARIN BOLUS VIA INFUSION
4000.0000 [IU] | Freq: Once | INTRAVENOUS | Status: AC
  Administered 2017-03-22: 4000 [IU] via INTRAVENOUS

## 2017-03-22 MED ORDER — NITROGLYCERIN 0.4 MG SL SUBL
0.4000 mg | SUBLINGUAL_TABLET | SUBLINGUAL | Status: DC | PRN
Start: 2017-03-22 — End: 2017-03-27
  Filled 2017-03-22: qty 1

## 2017-03-22 MED ORDER — NITROGLYCERIN 0.4 MG SL SUBL
0.4000 mg | SUBLINGUAL_TABLET | SUBLINGUAL | Status: DC | PRN
  Administered 2017-03-22: 0.4 mg via SUBLINGUAL

## 2017-03-22 MED ORDER — ASPIRIN 81 MG PO CHEW
324.0000 mg | CHEWABLE_TABLET | Freq: Once | ORAL | Status: AC
Start: 2017-03-22 — End: 2017-03-22
  Administered 2017-03-22: 324 mg via ORAL
  Filled 2017-03-22: qty 4

## 2017-03-22 NOTE — ED Provider Notes (Signed)
[facility] Provider Note   CSN: [id number] Arrival date & time: 03/22/17  2038  By signing my name below, I, Dora Sims, attest that this documentation has been prepared under the direction and in the presence of physician practitioner, Deno Etienne, DO. Electronically Signed: Dora Sims, Scribe. 03/22/2017. 8:58 PM.  History   Chief Complaint Chief Complaint  Patient presents with  . Cough    The history is provided by the patient. No language interpreter was used.  Cough  This is a new problem. The current episode started more than 1 week ago. The problem occurs hourly. The problem has been gradually worsening. The cough is non-productive. There has been no fever. Associated symptoms include chest pain and shortness of breath. He has tried nothing for the symptoms. He is not a smoker. His past medical history does not include bronchitis, bronchiectasis, COPD, emphysema or asthma.    HPI Comments: Joshua Raymond is a 61 y.o. male with PMHx including GERD and HTN who presents to the Emergency Department complaining of a persistent non-productive cough for one month. He reports some associated DOE with nausea. Patient has had intermittent abdominal pain, chest pain, and abdominal distention for several months and notes these symptoms have been worse since onset of his cough. He had an abdominal x-ray with view of his chest yesterday at his PCP's office which revealed findings concerning for CHF. His PCP started him on Lasix and pt reports some difficulty urinating since beginning this medication. Pt additionally had his prednisone dosage increased by his PCP and reports some diarrhea as a result. No alleviating factors noted. No h/o MI. No known ill contacts. He denies vomiting, fevers, or any other associated symptoms.  Past Medical History:  Diagnosis Date  . CHF (congestive heart failure) ([place])   . Complication of anesthesia   . GERD (gastroesophageal reflux disease)     . Hypertension   . PONV (postoperative nausea and vomiting)     There are no active problems to display for this patient.   Past Surgical History:  Procedure Laterality Date  . CERVICAL DISCECTOMY N/A 1995   C5C6  . COLONOSCOPY WITH PROPOFOL N/A 11/08/2016   Procedure: COLONOSCOPY WITH PROPOFOL;  Surgeon: Garlan Fair, MD;  Location: [facility];  Service: Endoscopy;  Laterality: N/A;  . ESOPHAGOGASTRODUODENOSCOPY (EGD) WITH PROPOFOL N/A 11/08/2016   Procedure: ESOPHAGOGASTRODUODENOSCOPY (EGD) WITH PROPOFOL;  Surgeon: Garlan Fair, MD;  Location: [facility];  Service: Endoscopy;  Laterality: N/A;  . KNEE ARTHROSCOPY Right 1973  . LAMINECTOMY N/A 2002  . SHOULDER ARTHROSCOPY W/ ROTATOR CUFF REPAIR Right 2000  . TENDON LENGTHENING Left 2013   Achilles tendon extension       Home Medications    Prior to Admission medications   Medication Sig Start Date End Date Taking? Authorizing Provider  cetirizine (ZYRTEC) 10 MG tablet Take 10 mg by mouth daily.     Historical Provider, MD  esomeprazole (NEXIUM) 40 MG capsule Take 40 mg by mouth every morning.     Historical Provider, MD  fluticasone (FLONASE) 50 MCG/ACT nasal spray Place 1-2 sprays into both nostrils daily as needed for allergies or rhinitis.    Historical Provider, MD  Ginger, Zingiber officinalis, (GINGER PO) Take 1 tablet by mouth daily as needed (indigestion).    Historical Provider, MD  Multiple Vitamin (MULTIVITAMIN WITH MINERALS) TABS tablet Take 2 tablets by mouth daily.    Historical Provider, MD  omeprazole (PRILOSEC) 20 MG capsule Take 20 mg  by mouth at bedtime.     Historical Provider, MD    Family History History reviewed. No pertinent family history.  Social History Social History  Substance Use Topics  . Smoking status: Former Research scientist (life sciences)  . Smokeless tobacco: Never Used     Comment: stopped 34 years ago  . Alcohol use Yes     Comment: moderation 1 drink a day during the week     Allergies    Other   Review of Systems Review of Systems  Constitutional: Negative for fever.  Respiratory: Positive for cough and shortness of breath.   Cardiovascular: Positive for chest pain.  Gastrointestinal: Positive for abdominal distention, abdominal pain, diarrhea and nausea. Negative for vomiting.  Genitourinary: Positive for difficulty urinating.  All other systems reviewed and are negative.  Physical Exam Updated Vital Signs BP 118/83   Pulse (!) 103   Temp 98.6 F (37 C) (Oral)   Resp (!) 22   Ht 6' (1.829 m)   Wt 260 lb (117.9 kg)   SpO2 94%   BMI 35.26 kg/m   Physical Exam  Constitutional: He is oriented to person, place, and time. He appears well-developed and well-nourished.  HENT:  Head: Normocephalic and atraumatic.  Eyes: EOM are normal. Pupils are equal, round, and reactive to light.  Neck: Normal range of motion. Neck supple. JVD present.  Cardiovascular: Normal rate and regular rhythm.  Exam reveals no gallop and no friction rub.   No murmur heard. Pulmonary/Chest: Tachypnea noted. No respiratory distress. He has no wheezes. He has rales.  Abdominal: He exhibits distension. There is no rebound and no guarding.  Abdomen is distended and tympanitic to percussion.  Musculoskeletal: Normal range of motion. He exhibits edema.  2+ pitting edema to the bilateral knees.  Neurological: He is alert and oriented to person, place, and time.  Skin: No rash noted. No pallor.  Psychiatric: He has a normal mood and affect. His behavior is normal.  Nursing note and vitals reviewed.  ED Treatments / Results  Labs (all labs ordered are listed, but only abnormal results are displayed) Labs Reviewed  CBC WITH DIFFERENTIAL/PLATELET - Abnormal; Notable for the following:       Result Value   WBC 12.0 (*)    Neutro Abs 9.6 (*)    Monocytes Absolute 1.5 (*)    All other components within normal limits  BRAIN NATRIURETIC PEPTIDE - Abnormal; Notable for the following:    B  Natriuretic Peptide 1,213.8 (*)    All other components within normal limits  TROPONIN I - Abnormal; Notable for the following:    Troponin I 1.71 (*)    All other components within normal limits  BASIC METABOLIC PANEL - Abnormal; Notable for the following:    Sodium 131 (*)    Chloride 95 (*)    Glucose, Bld 132 (*)    Creatinine, Ser 1.39 (*)    Calcium 8.5 (*)    GFR calc non Af Amer 53 (*)    All other components within normal limits    EKG  EKG Interpretation  Date/Time:  Wednesday March 22 2017 22:40:50 EDT Ventricular Rate:  108 PR Interval:  168 QRS Duration: 104 QT Interval:  358 QTC Calculation: 479 R Axis:   47 Text Interpretation:  Sinus tachycardia Anterior infarct , age undetermined Abnormal ECG better baseline with no noted st elevation in inferior leads.   Confirmed by Tyrone Nine MD, DANIEL [phone]) on 03/22/2017 10:44:56 PM  Radiology Dg Chest 2 View  Result Date: 03/22/2017 CLINICAL DATA:  Left upper chest pain x1 week with cough x1 month. Increasing dyspnea. EXAM: CHEST  2 VIEW COMPARISON:  03/21/2017 FINDINGS: Stable cardiomegaly. Aortic atherosclerosis. Small right greater than left pleural effusions with patchy airspace opacity at the right lung base some which may represent compressive atelectasis though adjacent pneumonia is not entirely excluded. Pulmonary vascular congestion is again seen. No acute nor suspicious osseous abnormalities. IMPRESSION: Stable cardiomegaly with mild CHF. Right greater than left small pleural effusions. Patchy airspace opacities at the right lung base cannot exclude adjacent pneumonia though more commonly, this is due to adjacent compressive atelectasis. Electronically Signed   By: Ashley Royalty M.D.   On: 03/22/2017 22:15   Dg Abd Acute W/chest  Result Date: 03/21/2017 CLINICAL DATA:  Shortness of breath, generalized pain EXAM: DG ABDOMEN ACUTE W/ 1V CHEST COMPARISON:  03/15/2017 FINDINGS: There is no evidence of dilated bowel  loops or free intraperitoneal air. No radiopaque calculi or other significant radiographic abnormality is seen. Trace bilateral pleural effusions. Mild bilateral interstitial thickening. No focal consolidation. Stable cardiomegaly. No acute osseous abnormality. IMPRESSION: Negative abdominal radiographs. Findings concerning for mild CHF. Electronically Signed   By: Kathreen Devoid   On: 03/21/2017 10:24    Procedures Procedures (including critical care time)  DIAGNOSTIC STUDIES: Oxygen Saturation is 95% on RA, adequate by my interpretation.    COORDINATION OF CARE: 9:04 PM Discussed treatment plan with pt at bedside and pt agreed to plan.  Medications Ordered in ED Medications  nitroGLYCERIN (NITROSTAT) SL tablet 0.4 mg (not administered)  nitroGLYCERIN (NITROSTAT) SL tablet 0.4 mg (0.4 mg Sublingual Given 03/22/17 2244)  aspirin chewable tablet 324 mg (324 mg Oral Given 03/22/17 2121)  ondansetron (ZOFRAN) injection 4 mg (4 mg Intravenous Given 03/22/17 2152)  furosemide (LASIX) injection 40 mg (40 mg Intravenous Given 03/22/17 2222)  heparin bolus via infusion 4,000 Units (4,000 Units Intravenous Bolus from Bag 03/22/17 2226)  heparin ADULT infusion 100 units/mL (25000 units/240mL sodium chloride 0.45%) (12 Units/kg/hr  117.9 kg Intravenous Transfusing/Transfer 03/22/17 2245)     Initial Impression / Assessment and Plan / ED Course  I have reviewed the triage vital signs and the nursing notes.  Pertinent labs & imaging results that were available during my care of the patient were reviewed by me and considered in my medical decision making (see chart for details).     61 yo with a cc of leg swelling, sob on exertion, chest pain.  Going on for past couple of days.  Severe on exertion, unable to move more than a step without becoming Severely nauseated and short of breath. Saw his PCP a couple days ago and had an x-ray showing new onset heart failure. Has a follow-up with the cardiologist  tomorrow. On my exam the patient has rales, JVD, pitting edema to the knees. Initial EKG was concerning for a possible STEMI though there was some baseline wander. On repeat EKG the ST elevation was not present. He does have a positive troponin at 1.7. Given lasix.  He is started on a heparin drip. Significant pertinent of symptoms with aspirin and nitroglycerin.  I discussed the case with Dr. Eula Fried, cards.  Will transfer to [facility] for [facility].   CRITICAL CARE Performed by: Cecilio Asper   Total critical care time: 45 minutes  Critical care time was exclusive of separately billable procedures and treating other patients.  Critical care was necessary to treat or prevent  imminent or life-threatening deterioration.  Critical care was time spent personally by me on the following activities: development of treatment plan with patient and/or surrogate as well as nursing, discussions with consultants, evaluation of patient's response to treatment, examination of patient, obtaining history from patient or surrogate, ordering and performing treatments and interventions, ordering and review of laboratory studies, ordering and review of radiographic studies, pulse oximetry and re-evaluation of patient's condition.   Final Clinical Impressions(s) / ED Diagnoses   Final diagnoses:  NSTEMI (non-ST elevated myocardial infarction) [facility])    New Prescriptions New Prescriptions   No medications on file   I personally performed the services described in this documentation, which was scribed in my presence. The recorded information has been reviewed and is accurate.     Deno Etienne, DO 03/22/17 2259

## 2017-03-22 NOTE — ED Notes (Signed)
Pt reports pain (RUQ) not relieved by Nitro continues at 6/10 - however he does report that Nitro relieved the shortness of breath and it is easier to breath. O2 @ 2lpm continued. Post Nitro BP is 118/83, map 93.

## 2017-03-22 NOTE — ED Triage Notes (Signed)
Patient states that he is having  Pain to his mid upper left area with dyspnea. He is being treated for CHF - and is being referred to an Cardiologist. Patient is tachypic and coughing with pain  - holding his chest and clearing his throat frequently in chest pain

## 2017-03-22 NOTE — H&P (Addendum)
CARDIOLOGY INPATIENT HISTORY AND PHYSICAL EXAMINATION NOTE  Patient ID: Joshua Raymond MRN: [id number], DOB/AGE: July 15, 1956   Admit date: 03/22/2017   Primary Physician: Mathews Argyle, MD Primary Cardiologist: new  Reason for admission: chest pain  HPI: This is a 61 y.o. male with no prior history of CAD, prior caths (in 1990s and 2000s which were -ve for CAD) who presented with cough and chest pain.  Patient started having nausea/indigestion for the last 6 months. He has been having EGD, CT, colonoscopy which were negative. He developed a dry cough for 1 mo and with minimal exertion he would get SOB and nauseated. Last week he was found to have pulmonary edema on CXRs. He went to his PCP yesterday and they did another CXR showing pulmonary edema so he prescribed lasix to him without much improvement in urine volume. He was also having chest pains on minimal exertion so he was referred to the ED. CP is mostly in the right upper quadrant of the abdomen, associated with dyspnea and is sharp in character. He went to [facility] where his troponin was found to be elevated and ECG showed some minimal ST elevation not meeting STEMI criteria in the inferior leads.    Problem List: Past Medical History:  Diagnosis Date  . CHF (congestive heart failure) ([place])   . Complication of anesthesia   . GERD (gastroesophageal reflux disease)   . Hypertension   . PONV (postoperative nausea and vomiting)     Past Surgical History:  Procedure Laterality Date  . bone spur surgery    . CERVICAL DISCECTOMY N/A 1995   C5C6  . COLONOSCOPY WITH PROPOFOL N/A 11/08/2016   Procedure: COLONOSCOPY WITH PROPOFOL;  Surgeon: Garlan Fair, MD;  [facility];  Service: Endoscopy;  Laterality: N/A;  . CORONARY ANGIOPLASTY    . ESOPHAGOGASTRODUODENOSCOPY (EGD) WITH PROPOFOL N/A 11/08/2016   Procedure: ESOPHAGOGASTRODUODENOSCOPY (EGD) WITH PROPOFOL;  Surgeon: Garlan Fair, MD;   [facility];  Service: Endoscopy;  Laterality: N/A;  . KNEE ARTHROSCOPY Right 1973  . KNEE SURGERY    . LAMINECTOMY N/A 2002  . LAMINECTOMY    . RIGHT/LEFT HEART CATH AND CORONARY ANGIOGRAPHY N/A 03/24/2017   Procedure: Right/Left Heart Cath and Coronary Angiography;  Surgeon: Belva Crome, MD;  Location: [facility];  Service: Cardiovascular;  Laterality: N/A;  . ROTATOR CUFF REPAIR    . SHOULDER ARTHROSCOPY W/ ROTATOR CUFF REPAIR Right 2000  . TENDON LENGTHENING Left 2013   Achilles tendon extension     Allergies:  Allergies  Allergen Reactions  . Other Nausea Only and Other (See Comments)    General anesthesia   Home Medications No current facility-administered medications for this encounter.    Current Outpatient Prescriptions  Medication Sig Dispense Refill  . aspirin EC 81 MG tablet Take 81 mg by mouth daily.    . cetirizine (ZYRTEC) 10 MG tablet Take 10 mg by mouth daily.     . fluticasone (FLONASE) 50 MCG/ACT nasal spray Place 1-2 sprays into both nostrils daily.     . Ginger, Zingiber officinalis, (GINGER PO) Take 1 tablet by mouth daily as needed (indigestion).    . Multiple Vitamin (MULTIVITAMIN WITH MINERALS) TABS tablet Take 2 tablets by mouth daily.    . ondansetron (ZOFRAN) 8 MG tablet Take 8 mg by mouth 2 (two) times daily as needed for nausea or vomiting.    . carvedilol (COREG) 3.125 MG tablet Take 1 tablet (3.125 mg  total) by mouth 2 (two) times daily with a meal. 60 tablet 6  . digoxin (LANOXIN) 0.125 MG tablet Take 1 tablet (0.125 mg total) by mouth daily. 30 tablet 6  . furosemide (LASIX) 40 MG tablet Take 1 tablet (40 mg total) by mouth daily. 30 tablet 6  . losartan (COZAAR) 25 MG tablet Take 1 tablet (25 mg total) by mouth 2 (two) times daily. 60 tablet 6  . pantoprazole (PROTONIX) 40 MG tablet Take 1 tablet (40 mg total) by mouth daily. 30 tablet 6  . spironolactone (ALDACTONE) 25 MG tablet Take 1 tablet (25 mg total) by mouth daily. 30  tablet 6     Family History  Problem Relation Age of Onset  . Ovarian cancer Mother   . Heart attack Father      Social History   Social History  . Marital status: Married    Spouse name: N/A  . Number of children: N/A  . Years of education: N/A   Occupational History  . Not on file.   Social History Main Topics  . Smoking status: Former Research scientist (life sciences)  . Smokeless tobacco: Never Used     Comment: stopped 34 years ago  . Alcohol use Yes     Comment: moderation 1 drink a day during the week  . Drug use: No  . Sexual activity: Not on file   Other Topics Concern  . Not on file   Social History Narrative  . No narrative on file     Review of Systems: General: unintentional gain in weight Cardiovascular: chest pain, dyspnea, cough, edema, PND, orthopnea Dermatological: negative for rash Respiratory: negative for cough or wheezing Urologic: negative for hematuria Abdominal: nausea, bloating and abdominal pain Neurologic: negative for visual changes, syncope, or dizziness Endocrine: no diabetes, no hypothyroidism Immunological: no lymph adenopathy Psych: non homicidal/suicidal  Physical Exam: Vitals: BP (!) 97/53 (BP Location: Right Arm)   Pulse 78   Temp 97.9 F (36.6 C) (Oral)   Resp 19   Ht 6' (1.829 m)   Wt 110.9 kg (244 lb 8 oz)   SpO2 99%   BMI 33.16 kg/m  General: not in acute distress Neck: JVP 12 cm, neck supple Heart: tachycardiacand rhythm, S1, S2, no murmurs  Lungs: crackles GI: non tender, non distended, bowel sounds present Extremities: 1+ edema Neuro: AAO x 3  Psych: normal affect, no anxiety   Labs:   No results found for this or any previous visit (from the past 24 hour(s)).   Radiology/Studies: Dg Chest 2 View  Result Date: 03/22/2017 CLINICAL DATA:  Left upper chest pain x1 week with cough x1 month. Increasing dyspnea. EXAM: CHEST  2 VIEW COMPARISON:  03/21/2017 FINDINGS: Stable cardiomegaly. Aortic atherosclerosis. Small right greater  than left pleural effusions with patchy airspace opacity at the right lung base some which may represent compressive atelectasis though adjacent pneumonia is not entirely excluded. Pulmonary vascular congestion is again seen. No acute nor suspicious osseous abnormalities. IMPRESSION: Stable cardiomegaly with mild CHF. Right greater than left small pleural effusions. Patchy airspace opacities at the right lung base cannot exclude adjacent pneumonia though more commonly, this is due to adjacent compressive atelectasis. Electronically Signed   By: Ashley Royalty M.D.   On: 03/22/2017 22:15   Dg Chest 2 View  Result Date: 03/15/2017 CLINICAL DATA:  Persistent cough for 1 month EXAM: CHEST  2 VIEW COMPARISON:  03/08/2010 FINDINGS: Cardiac shadow is mildly enlarged. The lungs are well aerated bilaterally. Mild blunting of  the costophrenic angles is seen consistent with small effusions. No focal infiltrate is noted. No bony abnormality is seen. IMPRESSION: Small bilateral pleural effusions. Electronically Signed   By: Inez Catalina M.D.   On: 03/15/2017 14:44   Dg Abd Acute W/chest  Result Date: 03/21/2017 CLINICAL DATA:  Shortness of breath, generalized pain EXAM: DG ABDOMEN ACUTE W/ 1V CHEST COMPARISON:  03/15/2017 FINDINGS: There is no evidence of dilated bowel loops or free intraperitoneal air. No radiopaque calculi or other significant radiographic abnormality is seen. Trace bilateral pleural effusions. Mild bilateral interstitial thickening. No focal consolidation. Stable cardiomegaly. No acute osseous abnormality. IMPRESSION: Negative abdominal radiographs. Findings concerning for mild CHF. Electronically Signed   By: Kathreen Devoid   On: 03/21/2017 10:24   Mr Cardiac Morphology W Wo Contrast  Result Date: 03/28/2017 CLINICAL DATA:  Cardiomyopathy of uncertain etiology EXAM: CARDIAC MRI TECHNIQUE: The patient was scanned on a 1.5 Tesla GE magnet. A dedicated cardiac coil was used. Functional imaging was done  using Fiesta sequences. 2,3, and 4 chamber views were done to assess for RWMA's. Modified Simpson's rule using a short axis stack was used to calculate an ejection fraction on a dedicated work Conservation officer, nature. The patient received 28 cc of Multihance. After 10 minutes inversion recovery sequences were used to assess for infiltration and scar tissue. FINDINGS: Limited images of the lung fields showed a moderate right pleural effusion. Severely dilated left ventricle, normal wall thickness. Severe diffuse hypokinesis, EF 14%. The right ventricle was mildly dilated with mildly decreased systolic function. Moderate left atrial enlargement. Mild right atrial enlargement. Trileaflet aortic valve with no stenosis or regurgitation. There was moderate mitral regurgitation (central), suspect due to annular dilatation. On delayed enhancement imaging, there was an area of mid-wall late gadolinium enhancement (LGE) in the basal inferolateral wall. There was a discrete are of full thickness to subepicardial LGE in the mid inferior wall. There was a small area of subepicardial insertion site LGE in the mid inferoseptal wall. There was patchy mid-wall LGE at the apex. MEASUREMENTS: MEASUREMENTS LVEDV 439 mL LVSV 63 mL LVEF 14% IMPRESSION: 1. Severely dilated left ventricle with severe diffuse hypokinesis, EF 14%. 2.  Mildly dilated RV with mildly decreased systolic function. 3.  Moderate central mitral regurgitation, likely secondary. 4. The LGE pattern appears to be non-coronary, possibly related to prior myocarditis. 5. There is one area at the apex that I questioned for LV thrombus based on the LGE images. However, on review with colleagues and review of the contrast-enhanced echo, I do not think that thrombus is present. Dalton Mclean Electronically Signed   By: Loralie Champagne M.D.   On: 03/28/2017 07:32    EKG: minimal ST elevation of the inferior leads, sinus tachycardia  Echo: pending  Cardiac cath:  prior 2 caths at [place] in 1990s and 2000s which were negative for CAD  Medical decision making:  Discussed care with the patient Discussed care with the physician on the phone Reviewed labs and imaging personally Reviewed prior records  ASSESSMENT AND PLAN:  This is a 61 y.o. male with no prior known history of CAD presented with CHF symptoms and elevated troponin   Principal Problem:   NSTEMI (non-ST elevated myocardial infarction) ([place]) Active Problems:   Acute congestive heart failure (HCC)   Elevated troponin   Acute kidney injury ([place])   Acute on chronic combined systolic and diastolic CHF (congestive heart failure) (H[facility])  NSTEMI, TIMI score 4, killip class III Cycle troponin,  serial EKGs prn, lipid panel, TSH, HbA1c, echocardiogram in the AM IV heparin, aspirin, high dose statin (lipitor 80 mg daily), Consider cardiac catheterization   Congestive heart failure, acute decompensated unknown LVEF Echocardiogram, IV lasix, daily weights, strict I/Os,   Cardiorenal syndrome, acute kidney injury - IV Lasix, strict I/Os, consider contrast reducing strategies if planned to have cardiac cath toorrow  Nausea zofran prn   Signed, Flossie Dibble, MD MS 03/29/2017, 9:18 PM

## 2017-03-23 ENCOUNTER — Encounter (HOSPITAL_COMMUNITY): Payer: Self-pay | Admitting: Internal Medicine

## 2017-03-23 ENCOUNTER — Ambulatory Visit: Payer: No Typology Code available for payment source | Admitting: Physician Assistant

## 2017-03-23 DIAGNOSIS — R7989 Other specified abnormal findings of blood chemistry: Secondary | ICD-10-CM

## 2017-03-23 DIAGNOSIS — I509 Heart failure, unspecified: Secondary | ICD-10-CM

## 2017-03-23 DIAGNOSIS — N179 Acute kidney failure, unspecified: Secondary | ICD-10-CM

## 2017-03-23 DIAGNOSIS — R778 Other specified abnormalities of plasma proteins: Secondary | ICD-10-CM

## 2017-03-23 LAB — CBC
HEMATOCRIT: 41.9 % (ref 39.0–52.0)
Hemoglobin: 14 g/dL (ref 13.0–17.0)
MCH: 29.9 pg (ref 26.0–34.0)
MCHC: 33.4 g/dL (ref 30.0–36.0)
MCV: 89.5 fL (ref 78.0–100.0)
Platelets: 202 10*3/uL (ref 150–400)
RBC: 4.68 MIL/uL (ref 4.22–5.81)
RDW: 13.4 % (ref 11.5–15.5)
WBC: 11.8 10*3/uL — ABNORMAL HIGH (ref 4.0–10.5)

## 2017-03-23 LAB — COMPREHENSIVE METABOLIC PANEL
ALT: 26 U/L (ref 17–63)
ANION GAP: 11 (ref 5–15)
AST: 27 U/L (ref 15–41)
Albumin: 3.5 g/dL (ref 3.5–5.0)
Alkaline Phosphatase: 50 U/L (ref 38–126)
BUN: 14 mg/dL (ref 6–20)
CALCIUM: 8.7 mg/dL — AB (ref 8.9–10.3)
CHLORIDE: 95 mmol/L — AB (ref 101–111)
CO2: 29 mmol/L (ref 22–32)
CREATININE: 1.51 mg/dL — AB (ref 0.61–1.24)
GFR, EST AFRICAN AMERICAN: 55 mL/min — AB (ref >60)
GFR, EST NON AFRICAN AMERICAN: 48 mL/min — AB (ref >60)
Glucose, Bld: 119 mg/dL — ABNORMAL HIGH (ref 65–99)
Potassium: 3.3 mmol/L — ABNORMAL LOW (ref 3.5–5.1)
SODIUM: 135 mmol/L (ref 135–145)
TOTAL PROTEIN: 6.6 g/dL (ref 6.5–8.1)
Total Bilirubin: 2 mg/dL — ABNORMAL HIGH (ref 0.3–1.2)

## 2017-03-23 LAB — HEPARIN LEVEL (UNFRACTIONATED)
HEPARIN UNFRACTIONATED: 0.18 [IU]/mL — AB (ref 0.30–0.70)
Heparin Unfractionated: 0.3 IU/mL (ref 0.30–0.70)

## 2017-03-23 LAB — TSH: TSH: 1.095 u[IU]/mL (ref 0.350–4.500)

## 2017-03-23 LAB — PROTIME-INR
INR: 1.34
Prothrombin Time: 16.7 seconds — ABNORMAL HIGH (ref 11.4–15.2)

## 2017-03-23 LAB — LIPID PANEL
CHOL/HDL RATIO: 3.5 ratio
Cholesterol: 117 mg/dL (ref 0–200)
HDL: 33 mg/dL — ABNORMAL LOW (ref >40)
LDL CALC: 72 mg/dL (ref 0–99)
Triglycerides: 62 mg/dL (ref <150)
VLDL: 12 mg/dL (ref 0–40)

## 2017-03-23 LAB — TROPONIN I
TROPONIN I: 1.08 ng/mL — AB (ref <0.03)
Troponin I: 1.54 ng/mL (ref <0.03)
Troponin I: 1.43 ng/mL (ref <0.03)

## 2017-03-23 LAB — BRAIN NATRIURETIC PEPTIDE: B NATRIURETIC PEPTIDE 5: 1323.8 pg/mL — AB (ref 0.0–100.0)

## 2017-03-23 LAB — HIV ANTIBODY (ROUTINE TESTING W REFLEX): HIV Screen 4th Generation wRfx: NONREACTIVE

## 2017-03-23 MED ORDER — BENZONATATE 100 MG PO CAPS
100.0000 mg | ORAL_CAPSULE | Freq: Two times a day (BID) | ORAL | Status: DC | PRN
Start: 2017-03-23 — End: 2017-03-27
  Administered 2017-03-23 – 2017-03-25 (×5): 100 mg via ORAL
  Filled 2017-03-23 (×5): qty 1

## 2017-03-23 MED ORDER — HEPARIN (PORCINE) IN NACL 100-0.45 UNIT/ML-% IJ SOLN
2100.0000 [IU]/h | INTRAMUSCULAR | Status: DC
  Administered 2017-03-23: 1800 [IU]/h via INTRAVENOUS
  Administered 2017-03-24: 2100 [IU]/h via INTRAVENOUS
  Filled 2017-03-23 (×3): qty 250

## 2017-03-23 MED ORDER — SODIUM CHLORIDE 0.9% FLUSH
3.0000 mL | Freq: Two times a day (BID) | INTRAVENOUS | Status: DC
  Administered 2017-03-23 – 2017-03-27 (×9): 3 mL via INTRAVENOUS

## 2017-03-23 MED ORDER — HEPARIN BOLUS VIA INFUSION
3000.0000 [IU] | Freq: Once | INTRAVENOUS | Status: AC
  Administered 2017-03-23: 3000 [IU] via INTRAVENOUS
  Filled 2017-03-23: qty 3000

## 2017-03-23 MED ORDER — FLUTICASONE PROPIONATE 50 MCG/ACT NA SUSP
1.0000 | Freq: Every day | NASAL | Status: DC | PRN
  Filled 2017-03-23: qty 16

## 2017-03-23 MED ORDER — ATORVASTATIN CALCIUM 80 MG PO TABS
80.0000 mg | ORAL_TABLET | Freq: Every day | ORAL | Status: DC
  Administered 2017-03-23 – 2017-03-25 (×3): 80 mg via ORAL
  Filled 2017-03-23 (×3): qty 1

## 2017-03-23 MED ORDER — ASPIRIN EC 81 MG PO TBEC
81.0000 mg | DELAYED_RELEASE_TABLET | Freq: Every day | ORAL | Status: DC
  Administered 2017-03-23 – 2017-03-27 (×5): 81 mg via ORAL
  Filled 2017-03-23 (×5): qty 1

## 2017-03-23 MED ORDER — ONDANSETRON HCL 4 MG/2ML IJ SOLN: 4.0000 mg | Freq: Four times a day (QID) | INTRAMUSCULAR | Status: DC | PRN

## 2017-03-23 MED ORDER — PNEUMOCOCCAL VAC POLYVALENT 25 MCG/0.5ML IJ INJ
0.5000 mL | INJECTION | INTRAMUSCULAR | Status: AC
  Administered 2017-03-24: 0.5 mL via INTRAMUSCULAR
  Filled 2017-03-23: qty 0.5

## 2017-03-23 MED ORDER — HEPARIN BOLUS VIA INFUSION
2000.0000 [IU] | Freq: Once | INTRAVENOUS | Status: AC
  Administered 2017-03-23: 2000 [IU] via INTRAVENOUS
  Filled 2017-03-23: qty 2000

## 2017-03-23 MED ORDER — POTASSIUM CHLORIDE CRYS ER 20 MEQ PO TBCR
40.0000 meq | EXTENDED_RELEASE_TABLET | Freq: Once | ORAL | Status: AC
  Administered 2017-03-23: 40 meq via ORAL
  Filled 2017-03-23: qty 2

## 2017-03-23 MED ORDER — PANTOPRAZOLE SODIUM 40 MG PO TBEC
40.0000 mg | DELAYED_RELEASE_TABLET | Freq: Every day | ORAL | Status: DC
  Administered 2017-03-23 – 2017-03-27 (×5): 40 mg via ORAL
  Filled 2017-03-23 (×5): qty 1

## 2017-03-23 MED ORDER — FUROSEMIDE 10 MG/ML IJ SOLN
40.0000 mg | Freq: Four times a day (QID) | INTRAMUSCULAR | Status: AC
  Administered 2017-03-23 (×4): 40 mg via INTRAVENOUS
  Filled 2017-03-23 (×4): qty 4

## 2017-03-23 MED ORDER — ONDANSETRON HCL 4 MG/2ML IJ SOLN
4.0000 mg | Freq: Once | INTRAMUSCULAR | Status: AC
  Administered 2017-03-23: 4 mg via INTRAVENOUS
  Filled 2017-03-23: qty 2

## 2017-03-23 NOTE — Progress Notes (Signed)
Progress Note  Patient Name: Joshua Raymond Date of Encounter: 03/23/2017  Primary Cardiologist: Reola Calkins (Raymond)  Subjective   Breathing is better, but reports being unable to lay flat comfortably at this time.   Inpatient Medications    Scheduled Meds: . aspirin EC  81 mg Oral Daily  . atorvastatin  80 mg Oral q1800  . furosemide  40 mg Intravenous Q6H  . heparin  3,000 Units Intravenous Once  . pantoprazole  40 mg Oral Daily  . sodium chloride flush  3 mL Intravenous Q12H   Continuous Infusions: . heparin     PRN Meds: fluticasone, nitroGLYCERIN, ondansetron (ZOFRAN) IV   Vital Signs    Vitals:   03/23/17 0000 03/23/17 0047 03/23/17 0600 03/23/17 0729  BP: 118/81 127/85 (!) 134/97 131/81  Pulse: (!) 108 (!) 108 88 98  Resp: 16 18 18  (!) 28  Temp:  98.5 F (36.9 C) 98.8 F (37.1 C)   TempSrc:  Oral Oral   SpO2: 96% 98% 99% 96%  Weight:  258 lb 4.8 oz (117.2 kg)    Height:  6' (1.829 m)      Intake/Output Summary (Last 24 hours) at 03/23/17 0825 Last data filed at 03/23/17 0731  Gross per 24 hour  Intake                0 ml  Output             2500 ml  Net            -2500 ml   Filed Weights   03/22/17 2041 03/23/17 0047  Weight: 260 lb (117.9 kg) 258 lb 4.8 oz (117.2 kg)    Telemetry    SR, PVCs - Personally Reviewed  ECG    ST - Personally Reviewed  Physical Exam   General: Well developed, well nourished, male appearing in no acute distress. Wearing [place] Head: Normocephalic, atraumatic.  Neck: Supple without bruits, JVD. Lungs:  Resp regular and unlabored, Diminished R>L. Heart: RRR, S1, S2, no S3, S4, or murmur; no rub. Abdomen: Soft, non-tender, distended with normoactive bowel sounds. No hepatomegaly. No rebound/guarding. No obvious abdominal masses. Extremities: No clubbing, cyanosis, 2+ LE edema. Distal pedal pulses are 2+ bilaterally. Neuro: Alert and oriented X 3. Moves all extremities spontaneously. Psych: Normal affect.  Labs      Chemistry Recent Labs Lab 03/22/17 2130 03/23/17 0518  NA 131* 135  K 3.6 3.3*  CL 95* 95*  CO2 24 29  GLUCOSE 132* 119*  BUN 17 14  CREATININE 1.39* 1.51*  CALCIUM 8.5* 8.7*  PROT  --  6.6  ALBUMIN  --  3.5  AST  --  27  ALT  --  26  ALKPHOS  --  50  BILITOT  --  2.0*  GFRNONAA 53* 48*  GFRAA >60 55*  ANIONGAP 12 11     Hematology Recent Labs Lab 03/22/17 2130 03/23/17 0518  WBC 12.0* 11.8*  RBC 4.42 4.68  HGB 13.4 14.0  HCT 39.3 41.9  MCV 88.9 89.5  MCH 30.3 29.9  MCHC 34.1 33.4  RDW 12.8 13.4  PLT 187 202    Cardiac Enzymes Recent Labs Lab 03/22/17 2130 03/23/17 0135 03/23/17 0518  TROPONINI 1.71* 1.54* 1.43*   No results for input(s): TROPIPOC in the last 168 hours.   BNP Recent Labs Lab 03/22/17 2130 03/23/17 0135  BNP 1,213.8* 1,323.8*     DDimer No results for input(s): DDIMER in the last 168  hours.    Radiology    Dg Chest 2 View  Result Date: 03/22/2017 CLINICAL DATA:  Left upper chest pain x1 week with cough x1 month. Increasing dyspnea. EXAM: CHEST  2 VIEW COMPARISON:  03/21/2017 FINDINGS: Stable cardiomegaly. Aortic atherosclerosis. Small right greater than left pleural effusions with patchy airspace opacity at the right lung base some which may represent compressive atelectasis though adjacent pneumonia is not entirely excluded. Pulmonary vascular congestion is again seen. No acute nor suspicious osseous abnormalities. IMPRESSION: Stable cardiomegaly with mild CHF. Right greater than left small pleural effusions. Patchy airspace opacities at the right lung base cannot exclude adjacent pneumonia though more commonly, this is due to adjacent compressive atelectasis. Electronically Signed   By: Ashley Royalty M.D.   On: 03/22/2017 22:15   Dg Abd Acute W/chest  Result Date: 03/21/2017 CLINICAL DATA:  Shortness of breath, generalized pain EXAM: DG ABDOMEN ACUTE W/ 1V CHEST COMPARISON:  03/15/2017 FINDINGS: There is no evidence of dilated  bowel loops or free intraperitoneal air. No radiopaque calculi or other significant radiographic abnormality is seen. Trace bilateral pleural effusions. Mild bilateral interstitial thickening. No focal consolidation. Stable cardiomegaly. No acute osseous abnormality. IMPRESSION: Negative abdominal radiographs. Findings concerning for mild CHF. Electronically Signed   By: Kathreen Devoid   On: 03/21/2017 10:24    Cardiac Studies   TTE: Pending  Patient Profile     61 y.o. male with PMH of caths (in the 27s, and early 2000s), HTN, GERD who presented with RUQ pain and acute CHF with mildly elevated troponin.   Assessment & Plan    1. Elevated Trop: Had some mild chest discomfort, but pain is mostly in the RUQ. Troponin elevation is in the setting of elevated BNP, and mildly elevated Cr. Reports having had "clean caths" years ago, but nothing recently.  -- check echo -- continued IV heparin for now -- lipid panel- LDL 72, HDL 33 -- Hgb A1c pending  2. Acute CHF: Reports having CXR done recently that showed "fluid" in his lungs and was placed on oral lasix. On admission CXR shows edema with pleural effusions R>L. BNP 1300. Good UOP thus far, weight trending down. Breathing has improved, but still uncomfortable when trying to lay flat.  -- continue on IV lasix -- daily BMET -- echo pending -- daily weights, I&Os  3. AKI: noted in the setting of diuresis. Follow BMET   4. GERD: Continue protonix  5. Hypokalemia: Replaced x1  Signed, Reino Bellis, NP  03/23/2017, 8:25 AM   Patient seen and examined and history reviewed. Agree with above findings and plan. Very pleasant 61 yo WM with a one month history of progressive DOE, orthopnea, PND and increased abdominal girth. Complains of pain in RUQ with deep breathing. Denies any other chest pain. Has to sleep in recliner. 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 EF 40-45%. Has remote history of HTN but states he was able to be weaned  off his medication.   On exam he is overweight. JVD is elevated Lungs with diminished BS throughout. Becomes acutely SOB lying supine. CV RRR without gallop, murmur, rub. Abd obese soft NT.  2+ edema  Ecg shows NSR with PVCs, possible old anterior infarct. Troponin mildly elevated with flat trend.  Impression: the central issue is decompensated CHF- most likely acute on chronic systolic CHF based on prior cath results. He needs IV diuresis. Strict I/O, sodium restriction, daily weight. Once volume status improved will start Coreg. Hold ACEi/ARB/aldactone for  now due to CKD. Assess LV function today. He may eventually need cardiac cath [facility] but unable to lie flat for procedure now and will need to monitor renal function closely with diuresis.   Joshua Raymond, [place] 03/23/2017 9:38 AM

## 2017-03-23 NOTE — Progress Notes (Signed)
[place] for heparin Indication: NSTEMI  Allergies  Allergen Reactions  . Other Nausea Only and Other (See Comments)    General anesthesia    Patient Measurements: Height: 6' (182.9 cm) Weight: 258 lb 4.8 oz (117.2 kg) IBW/kg (Calculated) : 77.6 Heparin Dosing Weight: 105kg  Vital Signs: Temp: 99.6 F (37.6 C) (04/26 1409) Temp Source: Oral (04/26 1409) BP: 113/72 (04/26 1409) Pulse Rate: 106 (04/26 1409)  Labs:  Recent Labs  03/22/17 2130 03/23/17 0135 03/23/17 0518 03/23/17 1357  HGB 13.4  --  14.0  --   HCT 39.3  --  41.9  --   PLT 187  --  202  --   LABPROT  --   --  16.7*  --   INR  --   --  1.34  --   HEPARINUNFRC  --   --  <0.10* 0.18*  CREATININE 1.39*  --  1.51*  --   TROPONINI 1.71* 1.54* 1.43*  --      Assessment: 61yo male c/o CP associated w/ dyspnea, troponin elevated, continues on heparin for NSTEMI, Heparin level low at 018  Goal of Therapy:  Heparin level 0.3-0.7 units/ml Monitor platelets by anticoagulation protocol: Yes   Plan:  Heparin 2000 unit bolus x 1 Heparin drip to 2000 units / hr 6 hour heparin level  Thank you Anette Guarneri, PharmD [phone]

## 2017-03-23 NOTE — Progress Notes (Addendum)
ANTICOAGULATION CONSULT NOTE - Initial Consult  Pharmacy Consult for heparin Indication: NSTEMI  Allergies  Allergen Reactions  . Other Nausea Only    General anesthesia    Patient Measurements: Height: 6' (182.9 cm) Weight: 258 lb 4.8 oz (117.2 kg) IBW/kg (Calculated) : 77.6 Heparin Dosing Weight: 105kg  Vital Signs: Temp: 98.5 F (36.9 C) (04/26 0047) Temp Source: Oral (04/26 0047) BP: 127/85 (04/26 0047) Pulse Rate: 108 (04/26 0047)  Labs:  Recent Labs  03/22/17 2130  HGB 13.4  HCT 39.3  PLT 187  CREATININE 1.39*  TROPONINI 1.71*    Estimated Creatinine Clearance: 72.8 mL/min (A) (by C-G formula based on SCr of 1.39 mg/dL (H)).   Medical History: Past Medical History:  Diagnosis Date  . CHF (congestive heart failure) ([place])   . Complication of anesthesia   . GERD (gastroesophageal reflux disease)   . Hypertension   . PONV (postoperative nausea and vomiting)     Medications:  Prescriptions Prior to Admission  Medication Sig Dispense Refill Last Dose  . cetirizine (ZYRTEC) 10 MG tablet Take 10 mg by mouth daily.    Past Week at Unknown time  . esomeprazole (NEXIUM) 40 MG capsule Take 40 mg by mouth every morning.    11/07/2016 at Unknown time  . fluticasone (FLONASE) 50 MCG/ACT nasal spray Place 1-2 sprays into both nostrils daily as needed for allergies or rhinitis.   11/07/2016 at Unknown time  . Ginger, Zingiber officinalis, (GINGER PO) Take 1 tablet by mouth daily as needed (indigestion).   Past Week at Unknown time  . Multiple Vitamin (MULTIVITAMIN WITH MINERALS) TABS tablet Take 2 tablets by mouth daily.   Past Week at Unknown time  . omeprazole (PRILOSEC) 20 MG capsule Take 20 mg by mouth at bedtime.    Past Week at Unknown time   Scheduled:  . aspirin EC  81 mg Oral Daily  . atorvastatin  80 mg Oral q1800  . pantoprazole  40 mg Oral Daily  . sodium chloride flush  3 mL Intravenous Q12H    Assessment: 61yo male c/o CP associated w/ dyspnea,  troponin elevated, to begin heparin for NSTEMI.  Goal of Therapy:  Heparin level 0.3-0.7 units/ml Monitor platelets by anticoagulation protocol: Yes   Plan:  Will give heparin 4000 units IV bolus x1 followed by gtt at 1400 units/hr and monitor heparin levels and CBC.  Wynona Neat, PharmD, BCPS  03/23/2017,1:10 AM   ADDENDUM: Heparin level this am is undetectable; RN reports that pt had turned IV off when it was beeping, but it seems that he did this after the am labs were drawn.  Will rebolus with heparin 3000 units and increase gtt by 4 units/kgABW/hr and check level in 6hr. VB   8:04 AM

## 2017-03-23 NOTE — Progress Notes (Deleted)
]      ROS ] 

## 2017-03-24 ENCOUNTER — Encounter (HOSPITAL_COMMUNITY)
Admission: EM | Disposition: A | Discharge status: Home / Self Care | Payer: Self-pay | Source: Home / Self Care | Attending: Cardiovascular Disease

## 2017-03-24 ENCOUNTER — Inpatient Hospital Stay (HOSPITAL_COMMUNITY): Payer: No Typology Code available for payment source

## 2017-03-24 ENCOUNTER — Encounter: Payer: Self-pay | Admitting: Physician Assistant

## 2017-03-24 DIAGNOSIS — I509 Heart failure, unspecified: Secondary | ICD-10-CM

## 2017-03-24 DIAGNOSIS — I5023 Acute on chronic systolic (congestive) heart failure: Secondary | ICD-10-CM

## 2017-03-24 DIAGNOSIS — I5021 Acute systolic (congestive) heart failure: Secondary | ICD-10-CM

## 2017-03-24 HISTORY — PX: RIGHT/LEFT HEART CATH AND CORONARY ANGIOGRAPHY: CATH118266

## 2017-03-24 LAB — POCT I-STAT 3, ART BLOOD GAS (G3+)
ACID-BASE EXCESS: 5 mmol/L — AB (ref 0.0–2.0)
Bicarbonate: 29.9 mmol/L — ABNORMAL HIGH (ref 20.0–28.0)
O2 SAT: 97 %
TCO2: 31 mmol/L (ref 0–100)
pCO2 arterial: 44.8 mmHg (ref 32.0–48.0)
pH, Arterial: 7.433 (ref 7.350–7.450)
pO2, Arterial: 90 mmHg (ref 83.0–108.0)

## 2017-03-24 LAB — BASIC METABOLIC PANEL
ANION GAP: 10 (ref 5–15)
BUN: 16 mg/dL (ref 6–20)
CALCIUM: 8.5 mg/dL — AB (ref 8.9–10.3)
CO2: 25 mmol/L (ref 22–32)
Chloride: 95 mmol/L — ABNORMAL LOW (ref 101–111)
Creatinine, Ser: 1.24 mg/dL (ref 0.61–1.24)
GFR calc non Af Amer: >60 mL/min (ref >60)
Glucose, Bld: 133 mg/dL — ABNORMAL HIGH (ref 65–99)
POTASSIUM: 3.5 mmol/L (ref 3.5–5.1)
Sodium: 130 mmol/L — ABNORMAL LOW (ref 135–145)

## 2017-03-24 LAB — POCT ACTIVATED CLOTTING TIME: Activated Clotting Time: 169 seconds

## 2017-03-24 LAB — ECHOCARDIOGRAM COMPLETE
Height: 72 in
WEIGHTICAEL: 4043.2 [oz_av]

## 2017-03-24 LAB — HEPARIN LEVEL (UNFRACTIONATED): HEPARIN UNFRACTIONATED: 0.3 [IU]/mL (ref 0.30–0.70)

## 2017-03-24 LAB — POCT I-STAT 3, VENOUS BLOOD GAS (G3P V)
ACID-BASE EXCESS: 5 mmol/L — AB (ref 0.0–2.0)
BICARBONATE: 29.9 mmol/L — AB (ref 20.0–28.0)
O2 Saturation: 56 %
PH VEN: 7.422 (ref 7.250–7.430)
TCO2: 31 mmol/L (ref 0–100)
pCO2, Ven: 45.9 mmHg (ref 44.0–60.0)
pO2, Ven: 29 mmHg — CL (ref 32.0–45.0)

## 2017-03-24 LAB — GLUCOSE, CAPILLARY: GLUCOSE-CAPILLARY: 145 mg/dL — AB (ref 65–99)

## 2017-03-24 LAB — CBC
HEMATOCRIT: 40.4 % (ref 39.0–52.0)
Hemoglobin: 13.4 g/dL (ref 13.0–17.0)
MCH: 29.3 pg (ref 26.0–34.0)
MCHC: 33.2 g/dL (ref 30.0–36.0)
MCV: 88.2 fL (ref 78.0–100.0)
PLATELETS: 213 10*3/uL (ref 150–400)
RBC: 4.58 MIL/uL (ref 4.22–5.81)
RDW: 13.1 % (ref 11.5–15.5)
WBC: 12.4 10*3/uL — AB (ref 4.0–10.5)

## 2017-03-24 LAB — HEMOGLOBIN A1C
Hgb A1c MFr Bld: 5.9 % — ABNORMAL HIGH (ref 4.8–5.6)
Mean Plasma Glucose: 123 mg/dL

## 2017-03-24 SURGERY — RIGHT/LEFT HEART CATH AND CORONARY ANGIOGRAPHY
Anesthesia: LOCAL

## 2017-03-24 MED ORDER — DIGOXIN 125 MCG PO TABS
0.1250 mg | ORAL_TABLET | Freq: Every day | ORAL | Status: DC
  Administered 2017-03-25 – 2017-03-27 (×3): 0.125 mg via ORAL
  Filled 2017-03-24 (×3): qty 1

## 2017-03-24 MED ORDER — SODIUM CHLORIDE 0.9 % IV SOLN
INTRAVENOUS | Status: DC
  Administered 2017-03-24: 16:00:00 via INTRAVENOUS

## 2017-03-24 MED ORDER — SODIUM CHLORIDE 0.9 % IV SOLN: 250.0000 mL | INTRAVENOUS | Status: DC | PRN

## 2017-03-24 MED ORDER — LIDOCAINE HCL 1 % IJ SOLN
INTRAMUSCULAR | Status: AC
  Filled 2017-03-24: qty 20

## 2017-03-24 MED ORDER — MIDAZOLAM HCL 2 MG/2ML IJ SOLN
INTRAMUSCULAR | Status: DC | PRN
  Administered 2017-03-24: 1 mg via INTRAVENOUS

## 2017-03-24 MED ORDER — POTASSIUM CHLORIDE CRYS ER 20 MEQ PO TBCR
20.0000 meq | EXTENDED_RELEASE_TABLET | Freq: Two times a day (BID) | ORAL | Status: DC
  Administered 2017-03-25 – 2017-03-26 (×4): 20 meq via ORAL
  Filled 2017-03-24 (×4): qty 1

## 2017-03-24 MED ORDER — OXYCODONE-ACETAMINOPHEN 5-325 MG PO TABS
1.0000 | ORAL_TABLET | ORAL | Status: DC | PRN
  Administered 2017-03-26 (×2): 2 via ORAL
  Filled 2017-03-24 (×2): qty 2

## 2017-03-24 MED ORDER — IOPAMIDOL (ISOVUE-370) INJECTION 76%
INTRAVENOUS | Status: AC
  Filled 2017-03-24: qty 125

## 2017-03-24 MED ORDER — HEPARIN (PORCINE) IN NACL 2-0.9 UNIT/ML-% IJ SOLN
INTRAMUSCULAR | Status: AC
  Filled 2017-03-24: qty 1000

## 2017-03-24 MED ORDER — SPIRONOLACTONE 25 MG PO TABS
12.5000 mg | ORAL_TABLET | Freq: Every day | ORAL | Status: DC
  Administered 2017-03-25 – 2017-03-27 (×3): 12.5 mg via ORAL
  Filled 2017-03-24 (×3): qty 1

## 2017-03-24 MED ORDER — ACETAMINOPHEN 325 MG PO TABS: 650.0000 mg | ORAL_TABLET | ORAL | Status: DC | PRN

## 2017-03-24 MED ORDER — HEPARIN SODIUM (PORCINE) 1000 UNIT/ML IJ SOLN
INTRAMUSCULAR | Status: AC
  Filled 2017-03-24: qty 1

## 2017-03-24 MED ORDER — VERAPAMIL HCL 2.5 MG/ML IV SOLN
INTRAVENOUS | Status: DC | PRN
  Administered 2017-03-24: 10 mL via INTRA_ARTERIAL

## 2017-03-24 MED ORDER — POTASSIUM CHLORIDE CRYS ER 20 MEQ PO TBCR
40.0000 meq | EXTENDED_RELEASE_TABLET | Freq: Once | ORAL | Status: AC
  Administered 2017-03-24: 40 meq via ORAL
  Filled 2017-03-24: qty 2

## 2017-03-24 MED ORDER — LOSARTAN POTASSIUM 25 MG PO TABS
25.0000 mg | ORAL_TABLET | Freq: Every day | ORAL | Status: DC
  Administered 2017-03-25: 25 mg via ORAL
  Filled 2017-03-24: qty 1

## 2017-03-24 MED ORDER — SODIUM CHLORIDE 0.9% FLUSH
3.0000 mL | Freq: Two times a day (BID) | INTRAVENOUS | Status: DC
  Administered 2017-03-24: 3 mL via INTRAVENOUS

## 2017-03-24 MED ORDER — HEPARIN SODIUM (PORCINE) 5000 UNIT/ML IJ SOLN
5000.0000 [IU] | Freq: Three times a day (TID) | INTRAMUSCULAR | Status: DC
  Administered 2017-03-25 – 2017-03-27 (×6): 5000 [IU] via SUBCUTANEOUS
  Filled 2017-03-24 (×6): qty 1

## 2017-03-24 MED ORDER — SODIUM CHLORIDE 0.9% FLUSH: 3.0000 mL | INTRAVENOUS | Status: DC | PRN

## 2017-03-24 MED ORDER — FUROSEMIDE 10 MG/ML IJ SOLN
40.0000 mg | Freq: Two times a day (BID) | INTRAMUSCULAR | Status: AC
  Administered 2017-03-25 – 2017-03-27 (×5): 40 mg via INTRAVENOUS
  Filled 2017-03-24 (×5): qty 4

## 2017-03-24 MED ORDER — FENTANYL CITRATE (PF) 100 MCG/2ML IJ SOLN
INTRAMUSCULAR | Status: DC | PRN
  Administered 2017-03-24: 50 ug via INTRAVENOUS

## 2017-03-24 MED ORDER — ASPIRIN 81 MG PO CHEW: 81.0000 mg | CHEWABLE_TABLET | ORAL | Status: DC

## 2017-03-24 MED ORDER — LIVING BETTER WITH HEART FAILURE BOOK
Freq: Once | Status: DC
  Filled 2017-03-24: qty 1

## 2017-03-24 MED ORDER — ONDANSETRON HCL 4 MG/2ML IJ SOLN
4.0000 mg | Freq: Four times a day (QID) | INTRAMUSCULAR | Status: DC | PRN
  Administered 2017-03-26: 4 mg via INTRAVENOUS

## 2017-03-24 MED ORDER — HEPARIN SODIUM (PORCINE) 1000 UNIT/ML IJ SOLN
INTRAMUSCULAR | Status: DC | PRN
  Administered 2017-03-24: 5000 [IU] via INTRAVENOUS

## 2017-03-24 MED ORDER — ZOLPIDEM TARTRATE 5 MG PO TABS
5.0000 mg | ORAL_TABLET | Freq: Every evening | ORAL | Status: DC | PRN
  Administered 2017-03-24: 5 mg via ORAL
  Filled 2017-03-24: qty 1

## 2017-03-24 MED ORDER — SODIUM CHLORIDE 0.9% FLUSH
3.0000 mL | Freq: Two times a day (BID) | INTRAVENOUS | Status: DC
  Administered 2017-03-25 – 2017-03-27 (×4): 3 mL via INTRAVENOUS

## 2017-03-24 MED ORDER — FENTANYL CITRATE (PF) 100 MCG/2ML IJ SOLN
INTRAMUSCULAR | Status: AC
  Filled 2017-03-24: qty 2

## 2017-03-24 MED ORDER — HEPARIN (PORCINE) IN NACL 2-0.9 UNIT/ML-% IJ SOLN
INTRAMUSCULAR | Status: DC | PRN
  Administered 2017-03-24: 1000 mL

## 2017-03-24 MED ORDER — PERFLUTREN LIPID MICROSPHERE
1.0000 mL | INTRAVENOUS | Status: AC | PRN
  Administered 2017-03-24: 3 mL via INTRAVENOUS
  Filled 2017-03-24 (×2): qty 10

## 2017-03-24 MED ORDER — LIDOCAINE HCL (PF) 1 % IJ SOLN
INTRAMUSCULAR | Status: DC | PRN
  Administered 2017-03-24: 14 mL
  Administered 2017-03-24 (×2): 3 mL

## 2017-03-24 MED ORDER — FUROSEMIDE 10 MG/ML IJ SOLN
40.0000 mg | Freq: Once | INTRAMUSCULAR | Status: AC
  Administered 2017-03-24: 40 mg via INTRAVENOUS
  Filled 2017-03-24: qty 4

## 2017-03-24 MED ORDER — MIDAZOLAM HCL 2 MG/2ML IJ SOLN
INTRAMUSCULAR | Status: AC
  Filled 2017-03-24: qty 2

## 2017-03-24 MED ORDER — VERAPAMIL HCL 2.5 MG/ML IV SOLN
INTRAVENOUS | Status: AC
  Filled 2017-03-24: qty 2

## 2017-03-24 SURGICAL SUPPLY — 17 items
CATH BALLN WEDGE 5F 110CM (CATHETERS) ×1 IMPLANT
CATH EXPO 5F FL3.5 (CATHETERS) ×1 IMPLANT
CATH INFINITI JR4 5F (CATHETERS) ×1 IMPLANT
CATH SWAN GANZ 7F STRAIGHT (CATHETERS) ×1 IMPLANT
COVER PRB 48X5XTLSCP FOLD TPE (BAG) IMPLANT
COVER PROBE 5X48 (BAG) ×2
DEVICE RAD COMP TR BAND LRG (VASCULAR PRODUCTS) ×1 IMPLANT
GLIDESHEATH SLEND A-KIT 6F 22G (SHEATH) ×1 IMPLANT
GUIDEWIRE .025 260CM (WIRE) ×1 IMPLANT
GUIDEWIRE INQWIRE 1.5J.035X260 (WIRE) IMPLANT
INQWIRE 1.5J .035X260CM (WIRE) ×2
KIT HEART LEFT (KITS) ×2 IMPLANT
PACK CARDIAC CATHETERIZATION (CUSTOM PROCEDURE TRAY) ×2 IMPLANT
SHEATH GLIDE SLENDER 4/5FR (SHEATH) ×1 IMPLANT
SHEATH PINNACLE 7F 10CM (SHEATH) ×1 IMPLANT
TRANSDUCER W/STOPCOCK (MISCELLANEOUS) ×2 IMPLANT
TUBING CIL FLEX 10 FLL-RA (TUBING) ×2 IMPLANT

## 2017-03-24 NOTE — Progress Notes (Signed)
Pt complained of feeling increasingly full around his mid section and also feeling like it was difficult to breath. O2 sats were in the 80's so applied 2 L [place] and sats returned to 90's-100%. Also, pt had a 13 beat run of vtach. Pt asymptomatic and vitals in normal range. Pgd on call Cardiology that put in a one time order of 40 mg IV lasix and 40 of Potassium. Will continue to monitor patient.

## 2017-03-24 NOTE — Progress Notes (Signed)
Progress Note  Patient Name: Joshua Raymond Date of Encounter: 03/24/2017  Primary Cardiologist: Reola Calkins ([place])  Subjective   Feeling much better. Breathing is better. No chest pain. States he is able to lie flat now.  Inpatient Medications    Scheduled Meds: . aspirin EC  81 mg Oral Daily  . atorvastatin  80 mg Oral q1800  . pantoprazole  40 mg Oral Daily  . sodium chloride flush  3 mL Intravenous Q12H   Continuous Infusions: . heparin 2,100 Units/hr (03/24/17 0134)   PRN Meds: benzonatate, fluticasone, nitroGLYCERIN, ondansetron (ZOFRAN) IV, zolpidem   Vital Signs    Vitals:   03/23/17 1409 03/23/17 1709 03/23/17 2058 03/24/17 0523  BP: 113/72 105/81 104/74 112/84  Pulse: (!) 106 (!) 102 (!) 103 (!) 102  Resp: 19 (!) 33 (!) 31 (!) 23  Temp: 99.6 F (37.6 C)  99.6 F (37.6 C) 98.6 F (37 C)  TempSrc: Oral  Oral Oral  SpO2: 94% 96% 95%   Weight:    252 lb 11.2 oz (114.6 kg)  Height:        Intake/Output Summary (Last 24 hours) at 03/24/17 1119 Last data filed at 03/24/17 1000  Gross per 24 hour  Intake          1122.43 ml  Output             2900 ml  Net         -1777.57 ml   Filed Weights   03/22/17 2041 03/23/17 0047 03/24/17 0523  Weight: 260 lb (117.9 kg) 258 lb 4.8 oz (117.2 kg) 252 lb 11.2 oz (114.6 kg)    Telemetry    SR, PVCs, couplets - Personally Reviewed  ECG    ST - Personally Reviewed  Physical Exam   General: Well developed, well nourished, male appearing in no acute distress. Wearing [place]: Normocephalic, atraumatic.  Neck: Supple without bruits, JVD. Lungs:  Resp regular and unlabored, Diminished R>L bases. Heart: RRR, S1, S2, + S3, no S4, or murmur; no rub. Abdomen: Soft, non-tender, distended with normoactive bowel sounds. No hepatomegaly. No rebound/guarding. No obvious abdominal masses. Extremities: No clubbing, cyanosis, 2+ LE edema. Distal pedal pulses are 2+ bilaterally. Neuro: Alert and oriented X 3. Moves all  extremities spontaneously. Psych: Normal affect.  Labs    Chemistry  Recent Labs Lab 03/22/17 2130 03/23/17 0518 03/24/17 0657  NA 131* 135 130*  K 3.6 3.3* 3.5  CL 95* 95* 95*  CO2 24 29 25   GLUCOSE 132* 119* 133*  BUN 17 14 16   CREATININE 1.39* 1.51* 1.24  CALCIUM 8.5* 8.7* 8.5*  PROT  --  6.6  --   ALBUMIN  --  3.5  --   AST  --  27  --   ALT  --  26  --   ALKPHOS  --  50  --   BILITOT  --  2.0*  --   GFRNONAA 53* 48* >60  GFRAA >60 55* >60  ANIONGAP 12 11 10      Hematology  Recent Labs Lab 03/22/17 2130 03/23/17 0518 03/24/17 0657  WBC 12.0* 11.8* 12.4*  RBC 4.42 4.68 4.58  HGB 13.4 14.0 13.4  HCT 39.3 41.9 40.4  MCV 88.9 89.5 88.2  MCH 30.3 29.9 29.3  MCHC 34.1 33.4 33.2  RDW 12.8 13.4 13.1  PLT 187 202 213    Cardiac Enzymes  Recent Labs Lab 03/22/17 2130 03/23/17 0135 03/23/17 0518 03/23/17 1357  TROPONINI 1.71*  1.54* 1.43* 1.08*   No results for input(s): TROPIPOC in the last 168 hours.   BNP  Recent Labs Lab 03/22/17 2130 03/23/17 0135  BNP 1,213.8* 1,323.8*     DDimer No results for input(s): DDIMER in the last 168 hours.    Radiology    Dg Chest 2 View  Result Date: 03/22/2017 CLINICAL DATA:  Left upper chest pain x1 week with cough x1 month. Increasing dyspnea. EXAM: CHEST  2 VIEW COMPARISON:  03/21/2017 FINDINGS: Stable cardiomegaly. Aortic atherosclerosis. Small right greater than left pleural effusions with patchy airspace opacity at the right lung base some which may represent compressive atelectasis though adjacent pneumonia is not entirely excluded. Pulmonary vascular congestion is again seen. No acute nor suspicious osseous abnormalities. IMPRESSION: Stable cardiomegaly with mild CHF. Right greater than left small pleural effusions. Patchy airspace opacities at the right lung base cannot exclude adjacent pneumonia though more commonly, this is due to adjacent compressive atelectasis. Electronically Signed   By: Ashley Royalty  M.D.   On: 03/22/2017 22:15    Cardiac Studies   Echo: Study Conclusions  - Left ventricle: The cavity size was severely dilated. Systolic   function was severely reduced. The estimated ejection fraction   was in the range of 10% to 15%. Diffuse hypokinesis. Severe   hypokinesis of the entire myocardium. Akinesis of the apical   myocardium. Doppler parameters are consistent with a reversible   restrictive pattern, indicative of decreased left ventricular   diastolic compliance and/or increased left atrial pressure (grade   3 diastolic dysfunction). - Mitral valve: There was moderate regurgitation. - Right atrium: The atrium was mildly to moderately dilated.   Patient Profile     61 y.o. male with PMH of caths (in the 17s, and early 2000s), HTN, GERD who presented with RUQ pain and acute CHF with mildly elevated troponin.   Assessment & Plan    1. Elevated Trop: Had some mild chest discomfort, but mostly complains of dyspnea. Troponin elevation is in the setting of elevated BNP, and mildly elevated Cr. Troponin trend is flat. I think troponin elevation is due to his CHF. He still needs cardiac cath to rule out CAD. -- continued IV heparin for now -- lipid panel- LDL 72, HDL 33 -- Hgb A1c normal.  2. Acute systolic CHF: Cardiac cath in 2011 showed no obstructive CAD and EF 40-45%. really on no CHF therapy and no cardiac follow up since then. Now with one month history of progressive dyspnea, cough, PND, orthopnea, and weight gain. EF now 10-15%.  On admission CXR shows edema with pleural effusions R>L. BNP 1300. Excellent UOP thus far, weight  Down 8 lbs. I/O negative 4.8 liters. Breathing has improved. -- continue on IV lasix -- daily BMET -- daily weights, I&Os -- [facility] today.  --plan consult for [facility] failure team.  3. AKI: secondary to acute CHF. Creatinine improving with diuresis.  4. GERD: Continue protonix  5. Hypokalemia: Replaced x1  6. Hyponatremia.  Secondary to CHF.  Signed,  Peter [place], [place] 03/24/2017 11:19 AM

## 2017-03-24 NOTE — Progress Notes (Addendum)
TR Band removed from right radial. Site is a Level 0. Gauze and Tegaderm applied. Right brachial and Right groin assessed and at a level 0 as well. WIll continue to monitor.

## 2017-03-24 NOTE — Progress Notes (Signed)
  Echocardiogram 2D Echocardiogram with definity has been performed.  Joshua Raymond M 03/24/2017, 9:33 AM

## 2017-03-24 NOTE — Interval H&P Note (Signed)
Cath Lab Visit (complete for each Cath Lab visit)  Clinical Evaluation Leading to the Procedure:   ACS: No.  Non-ACS:    Anginal Classification: CCS IV  Anti-ischemic medical therapy: Maximal Therapy (2 or more classes of medications)  Non-Invasive Test Results: No non-invasive testing performed  Prior CABG: No previous CABG      History and Physical Interval Note:  03/24/2017 4:18 PM  Joshua Raymond  has presented today for surgery, with the diagnosis of cp  The various methods of treatment have been discussed with the patient and family. After consideration of risks, benefits and other options for treatment, the patient has consented to  Procedure(s): Right/Left Heart Cath and Coronary Angiography (N/A) as a surgical intervention .  The patient's history has been reviewed, patient examined, no change in status, stable for surgery.  I have reviewed the patient's chart and labs.  Questions were answered to the patient's satisfaction.     Belva Crome III

## 2017-03-24 NOTE — CV Procedure (Signed)
   Widely patent coronary arteries.  Severe left ventricular dysfunction documented by echo to be less than 20%. Moderately severe elevation in LVEDP at 31 mmHg. This is consistent with a mean pulmonary capillary wedge pressure of 31 mmHg.  Moderate pulmonary hypertension with a mean pressure of 42 mmHg

## 2017-03-24 NOTE — Progress Notes (Signed)
ANTICOAGULATION CONSULT NOTE - Follow Up Consult  Pharmacy Consult for Heparin  Indication: NSTEMI  Allergies  Allergen Reactions  . Other Nausea Only and Other (See Comments)    General anesthesia   Patient Measurements: Height: 6' (182.9 cm) Weight: 258 lb 4.8 oz (117.2 kg) IBW/kg (Calculated) : 77.6  Vital Signs: Temp: 99.6 F (37.6 C) (04/26 2058) Temp Source: Oral (04/26 6435) BP: 104/74 (04/26 2058) Pulse Rate: 103 (04/26 2058)  Labs:  Recent Labs  03/22/17 2130 03/23/17 0135 03/23/17 0518 03/23/17 1357 03/23/17 2307  HGB 13.4  --  14.0  --   --   HCT 39.3  --  41.9  --   --   PLT 187  --  202  --   --   LABPROT  --   --  16.7*  --   --   INR  --   --  1.34  --   --   HEPARINUNFRC  --   --  <0.10* 0.18* 0.30  CREATININE 1.39*  --  1.51*  --   --   TROPONINI 1.71* 1.54* 1.43* 1.08*  --     Estimated Creatinine Clearance: 67 mL/min (A) (by C-G formula based on SCr of 1.51 mg/dL (H)).  Assessment: Heparin for NSTEMI, unable to lay flat for cath currently, heparin level on low end of therapeutic range after rate increase  Goal of Therapy:  Heparin level 0.3-0.7 units/ml Monitor platelets by anticoagulation protocol: Yes   Plan:  -Inc heparin slightly to 2100 units/hr to prevent sub-therapeutic level -Confirmatory HL with AM labs  Narda Bonds 03/24/2017,12:58 AM

## 2017-03-24 NOTE — H&P (View-Only) (Signed)
Progress Note  Patient Name: Joshua Raymond Date of Encounter: 03/24/2017  Primary Cardiologist: Reola Calkins ([place])  Subjective   Feeling much better. Breathing is better. No chest pain. States he is able to lie flat now.  Inpatient Medications    Scheduled Meds: . aspirin EC  81 mg Oral Daily  . atorvastatin  80 mg Oral q1800  . pantoprazole  40 mg Oral Daily  . sodium chloride flush  3 mL Intravenous Q12H   Continuous Infusions: . heparin 2,100 Units/hr (03/24/17 0134)   PRN Meds: benzonatate, fluticasone, nitroGLYCERIN, ondansetron (ZOFRAN) IV, zolpidem   Vital Signs    Vitals:   03/23/17 1409 03/23/17 1709 03/23/17 2058 03/24/17 0523  BP: 113/72 105/81 104/74 112/84  Pulse: (!) 106 (!) 102 (!) 103 (!) 102  Resp: 19 (!) 33 (!) 31 (!) 23  Temp: 99.6 F (37.6 C)  99.6 F (37.6 C) 98.6 F (37 C)  TempSrc: Oral  Oral Oral  SpO2: 94% 96% 95%   Weight:    252 lb 11.2 oz (114.6 kg)  Height:        Intake/Output Summary (Last 24 hours) at 03/24/17 1119 Last data filed at 03/24/17 1000  Gross per 24 hour  Intake          1122.43 ml  Output             2900 ml  Net         -1777.57 ml   Filed Weights   03/22/17 2041 03/23/17 0047 03/24/17 0523  Weight: 260 lb (117.9 kg) 258 lb 4.8 oz (117.2 kg) 252 lb 11.2 oz (114.6 kg)    Telemetry    SR, PVCs, couplets - Personally Reviewed  ECG    ST - Personally Reviewed  Physical Exam   General: Well developed, well nourished, male appearing in no acute distress. Wearing [place] Head: Normocephalic, atraumatic.  Neck: Supple without bruits, JVD. Lungs:  Resp regular and unlabored, Diminished R>L bases. Heart: RRR, S1, S2, + S3, no S4, or murmur; no rub. Abdomen: Soft, non-tender, distended with normoactive bowel sounds. No hepatomegaly. No rebound/guarding. No obvious abdominal masses. Extremities: No clubbing, cyanosis, 2+ LE edema. Distal pedal pulses are 2+ bilaterally. Neuro: Alert and oriented X 3. Moves all  extremities spontaneously. Psych: Normal affect.  Labs    Chemistry  Recent Labs Lab 03/22/17 2130 03/23/17 0518 03/24/17 0657  NA 131* 135 130*  K 3.6 3.3* 3.5  CL 95* 95* 95*  CO2 24 29 25   GLUCOSE 132* 119* 133*  BUN 17 14 16   CREATININE 1.39* 1.51* 1.24  CALCIUM 8.5* 8.7* 8.5*  PROT  --  6.6  --   ALBUMIN  --  3.5  --   AST  --  27  --   ALT  --  26  --   ALKPHOS  --  50  --   BILITOT  --  2.0*  --   GFRNONAA 53* 48* >60  GFRAA >60 55* >60  ANIONGAP 12 11 10      Hematology  Recent Labs Lab 03/22/17 2130 03/23/17 0518 03/24/17 0657  WBC 12.0* 11.8* 12.4*  RBC 4.42 4.68 4.58  HGB 13.4 14.0 13.4  HCT 39.3 41.9 40.4  MCV 88.9 89.5 88.2  MCH 30.3 29.9 29.3  MCHC 34.1 33.4 33.2  RDW 12.8 13.4 13.1  PLT 187 202 213    Cardiac Enzymes  Recent Labs Lab 03/22/17 2130 03/23/17 0135 03/23/17 0518 03/23/17 1357  TROPONINI 1.71*  1.54* 1.43* 1.08*   No results for input(s): TROPIPOC in the last 168 hours.   BNP  Recent Labs Lab 03/22/17 2130 03/23/17 0135  BNP 1,213.8* 1,323.8*     DDimer No results for input(s): DDIMER in the last 168 hours.    Radiology    Dg Chest 2 View  Result Date: 03/22/2017 CLINICAL DATA:  Left upper chest pain x1 week with cough x1 month. Increasing dyspnea. EXAM: CHEST  2 VIEW COMPARISON:  03/21/2017 FINDINGS: Stable cardiomegaly. Aortic atherosclerosis. Small right greater than left pleural effusions with patchy airspace opacity at the right lung base some which may represent compressive atelectasis though adjacent pneumonia is not entirely excluded. Pulmonary vascular congestion is again seen. No acute nor suspicious osseous abnormalities. IMPRESSION: Stable cardiomegaly with mild CHF. Right greater than left small pleural effusions. Patchy airspace opacities at the right lung base cannot exclude adjacent pneumonia though more commonly, this is due to adjacent compressive atelectasis. Electronically Signed   By: Ashley Royalty  M.D.   On: 03/22/2017 22:15    Cardiac Studies   Echo: Study Conclusions  - Left ventricle: The cavity size was severely dilated. Systolic   function was severely reduced. The estimated ejection fraction   was in the range of 10% to 15%. Diffuse hypokinesis. Severe   hypokinesis of the entire myocardium. Akinesis of the apical   myocardium. Doppler parameters are consistent with a reversible   restrictive pattern, indicative of decreased left ventricular   diastolic compliance and/or increased left atrial pressure (grade   3 diastolic dysfunction). - Mitral valve: There was moderate regurgitation. - Right atrium: The atrium was mildly to moderately dilated.   Patient Profile     61 y.o. male with PMH of caths (in the 35s, and early 2000s), HTN, GERD who presented with RUQ pain and acute CHF with mildly elevated troponin.   Assessment & Plan    1. Elevated Trop: Had some mild chest discomfort, but mostly complains of dyspnea. Troponin elevation is in the setting of elevated BNP, and mildly elevated Cr. Troponin trend is flat. I think troponin elevation is due to his CHF. He still needs cardiac cath to rule out CAD. -- continued IV heparin for now -- lipid panel- LDL 72, HDL 33 -- Hgb A1c normal.  2. Acute systolic CHF: Cardiac cath in 2011 showed no obstructive CAD and EF 40-45%. really on no CHF therapy and no cardiac follow up since then. Now with one month history of progressive dyspnea, cough, PND, orthopnea, and weight gain. EF now 10-15%.  On admission CXR shows edema with pleural effusions R>L. BNP 1300. Excellent UOP thus far, weight  Down 8 lbs. I/O negative 4.8 liters. Breathing has improved. -- continue on IV lasix -- daily BMET -- daily weights, I&Os -- [facility] today.  --plan consult for Advance heart failure team.  3. AKI: secondary to acute CHF. Creatinine improving with diuresis.  4. GERD: Continue protonix  5. Hypokalemia: Replaced x1  6. Hyponatremia.  Secondary to CHF.  Signed,  Lakin Romer [place], [place] 03/24/2017 11:19 AM

## 2017-03-24 NOTE — Care Management Note (Signed)
Case Management Note  Patient Details  Name: Joshua Raymond MRN: [id number] Date of Birth: Jun 10, 1956  Subjective/Objective:                 Patient admitted form home with wife. Extensive cardiac history, now with EF of 10-15%. Anticipate cath per cardiology note.    Action/Plan:  CM will continue to follow.   Expected Discharge Date:                  Expected Discharge Plan:  [place]  In-House Referral:     Discharge planning Services  CM Consult  Post Acute Care Choice:    Choice offered to:     DME Arranged:    DME Agency:     HH Arranged:    Section Agency:     Status of Service:  In process, will continue to follow  If discussed at Long Length of Stay Meetings, dates discussed:    Additional Comments:  Carles Collet, RN 03/24/2017, 12:29 PM

## 2017-03-24 NOTE — Consult Note (Signed)
Advanced Heart Failure Team Consult Note   Primary Physician: Dr Felipa Eth Primary Cardiologist:  New (Dr. [place]) Primary HF: New (Dr. Haroldine Laws)  Reason for Consultation: Newly reduced EF  HPI:    Joshua Raymond is seen today for evaluation of acute on chronic systolic CHF at the request of Dr. [place].  Joshua Raymond is a 61 y.o. male with PMH of HTN, GERD, and Systolic CHF (EF previously 40-45% by cath 2011) who presented to [facility] with RUQ pain and acute CHF with mildly elevated troponin. Pertinent labs on admission include Troponin 1.71, Creatinine 1.39, K 3.6, BNP 1322.  Previous cath in 2011 showed no obstructive CAD and EF 40-45%. Has not had CHF therapy or follow up since then. Pt has had 2 caths. 1st was due to chest pain from esophageal spasm, and 2011 had + stress test after a period of malaise so proceeded to cath.   Pt states he has had nausea and indigestion for past six months. Workup up thus far including EGD/Colon and CT has been negative. Over past month has been SOB with minimal exertion. Seen by PCP last week and earlier this week with CXR + for pulmonary edema. Started on lasix with minimal relief. Pt states at his baseline he was walking 45 minutes on TM or elliptical without difficulty. Over past month, and especially past week has been SOB ambulating around the house. Denies SOB with changing clothes or bathing. + bendopnea, orthopnea, and PND. Mild peripheral edema at worst. Stopped smoking 34 years ago with approx 15 pack year history. Drinks 4-5 alcoholic beverages a week ( usually one daily at most). Denies drug use.   He states his father passed away at 87 yo from MI (was heavy smoker)  Out 3.1 L yesterday on IV lasix 40 mg q6hrs.  Last dose lasix 2224 yesterday evening.  Seems to be auto-diuresing OK today.   Echo 03/24/17 LVEF 10-15%, Grade 3 DD, Moderate MR, mild/mod RAE.   Review of Systems: [y] = yes, [ ]  = no   General: Weight gain [y]; Weight loss [ ] ;  Anorexia [ ] ; Fatigue [y]; Fever [ ] ; Chills [ ] ; Weakness [ ]   Cardiac: Chest pain/pressure [ ] ; Resting SOB [ ] ; Exertional SOB [y]; Orthopnea [y]; Pedal Edema [y]; Palpitations [ ] ; Syncope [ ] ; Presyncope [ ] ; Paroxysmal nocturnal dyspnea[y]  Pulmonary: Cough [ ] ; Wheezing[ ] ; Hemoptysis[ ] ; Sputum [ ] ; Snoring [y]  GI: Vomiting[ ] ; Dysphagia[ ] ; Melena[ ] ; Hematochezia [ ] ; Heartburn[ ] ; Abdominal pain [ ] ; Constipation [ ] ; Diarrhea [ ] ; BRBPR [ ]   GU: Hematuria[ ] ; Dysuria [ ] ; Nocturia[ ]   Vascular: Pain in legs with walking [ ] ; Pain in feet with lying flat [ ] ; Non-healing sores [ ] ; Stroke [ ] ; TIA [ ] ; Slurred speech [ ] ;  Neuro: Headaches[ ] ; Vertigo[ ] ; Seizures[ ] ; Paresthesias[ ] ;Blurred vision [ ] ; Diplopia [ ] ; Vision changes [ ]   Ortho/Skin: Arthritis [ ] ; Joint pain [ ] ; Muscle pain [ ] ; Joint swelling [ ] ; Back Pain [ ] ; Rash [ ]   Psych: Depression[ ] ; Anxiety[ ]   Heme: Bleeding problems [ ] ; Clotting disorders [ ] ; Anemia [ ]   Endocrine: Diabetes [ ] ; Thyroid dysfunction[ ]   Home Medications Prior to Admission medications   Medication Sig Start Date End Date Taking? Authorizing Provider  aspirin EC 81 MG tablet Take 81 mg by mouth daily.   Yes Historical Provider, MD  cetirizine (ZYRTEC) 10 MG tablet  Take 10 mg by mouth daily.    Yes Historical Provider, MD  Esomeprazole Magnesium 20 MG TBEC Take 20 mg by mouth every morning.    Yes Historical Provider, MD  fluticasone (FLONASE) 50 MCG/ACT nasal spray Place 1-2 sprays into both nostrils daily.    Yes Historical Provider, MD  furosemide (LASIX) 20 MG tablet Take 40 mg by mouth daily.   Yes Historical Provider, MD  Ginger, Zingiber officinalis, (GINGER PO) Take 1 tablet by mouth daily as needed (indigestion).   Yes Historical Provider, MD  Multiple Vitamin (MULTIVITAMIN WITH MINERALS) TABS tablet Take 2 tablets by mouth daily.   Yes Historical Provider, MD  omeprazole (PRILOSEC) 20 MG capsule Take 20 mg by mouth at bedtime.     Yes Historical Provider, MD  ondansetron (ZOFRAN) 8 MG tablet Take 8 mg by mouth 2 (two) times daily as needed for nausea or vomiting.   Yes Historical Provider, MD    Past Medical History: Past Medical History:  Diagnosis Date  . CHF (congestive heart failure) ([place])   . Complication of anesthesia   . GERD (gastroesophageal reflux disease)   . Hypertension   . PONV (postoperative nausea and vomiting)     Past Surgical History: Past Surgical History:  Procedure Laterality Date  . bone spur surgery    . CERVICAL DISCECTOMY N/A 1995   C5C6  . COLONOSCOPY WITH PROPOFOL N/A 11/08/2016   Procedure: COLONOSCOPY WITH PROPOFOL;  Surgeon: Garlan Fair, MD;  Location: [facility] ENDOSCOPY;  Service: Endoscopy;  Laterality: N/A;  . CORONARY ANGIOPLASTY    . ESOPHAGOGASTRODUODENOSCOPY (EGD) WITH PROPOFOL N/A 11/08/2016   Procedure: ESOPHAGOGASTRODUODENOSCOPY (EGD) WITH PROPOFOL;  Surgeon: Garlan Fair, MD;  Location: [facility] ENDOSCOPY;  Service: Endoscopy;  Laterality: N/A;  . KNEE ARTHROSCOPY Right 1973  . KNEE SURGERY    . LAMINECTOMY N/A 2002  . LAMINECTOMY    . ROTATOR CUFF REPAIR    . SHOULDER ARTHROSCOPY W/ ROTATOR CUFF REPAIR Right 2000  . TENDON LENGTHENING Left 2013   Achilles tendon extension    Family History: Family History  Problem Relation Age of Onset  . Ovarian cancer Mother   . Heart attack Father     Social History: Social History   Social History  . Marital status: Married    Spouse name: N/A  . Number of children: N/A  . Years of education: N/A   Social History Main Topics  . Smoking status: Former Research scientist (life sciences)  . Smokeless tobacco: Never Used     Comment: stopped 34 years ago  . Alcohol use Yes     Comment: moderation 1 drink a day during the week  . Drug use: No  . Sexual activity: Not Asked   Other Topics Concern  . None   Social History Narrative  . None    Allergies:  Allergies  Allergen Reactions  . Other Nausea Only and Other (See Comments)      General anesthesia    Objective:    Vital Signs:   Temp:  [98.6 F (37 C)-99.6 F (37.6 C)] 98.6 F (37 C) (04/27 0523) Pulse Rate:  [102-106] 102 (04/27 0523) Resp:  [19-33] 23 (04/27 0523) BP: (104-113)/(72-84) 112/84 (04/27 0523) SpO2:  [94 %-96 %] 95 % (04/26 0160) Weight:  [252 lb 11.2 oz (114.6 kg)] 252 lb 11.2 oz (114.6 kg) (04/27 0523) Last BM Date: 03/22/17  Weight change: Filed Weights   03/22/17 2041 03/23/17 0047 03/24/17 0523  Weight: 260 lb (117.9 kg) 258  lb 4.8 oz (117.2 kg) 252 lb 11.2 oz (114.6 kg)    Intake/Output:   Intake/Output Summary (Last 24 hours) at 03/24/17 1202 Last data filed at 03/24/17 1000  Gross per 24 hour  Intake          1122.43 ml  Output             2600 ml  Net         -1477.57 ml     Physical Exam: General:  Obese, well appearing caucasian male in NAD.  HEENT: Normal Neck: supple. JVP 8-9 with prominent CV wave. Carotids 2+ bilat; no bruits. No lymphadenopathy or thyromegaly appreciated. Cor: PMI lateral. Regular, slightly tachy. ? Mild MR murmur. Heart sounds slightly distant 2/2 body habitus. Lungs: Mildly diminished basilar sounds.  Abdomen: Obese, soft, nontender, nondistended. No hepatosplenomegaly. No bruits or masses. Good bowel sounds. Extremities: no cyanosis, clubbing, or rash. Trace ankle edema. Neuro: alert & orientedx3, cranial nerves grossly intact. moves all 4 extremities w/o difficulty. Affect pleasant  Telemetry: Personally reviewed, Sinus tachycardia 100-110s  Labs: Basic Metabolic Panel:  Recent Labs Lab 03/22/17 2130 03/23/17 0518 03/24/17 0657  NA 131* 135 130*  K 3.6 3.3* 3.5  CL 95* 95* 95*  CO2 24 29 25   GLUCOSE 132* 119* 133*  BUN 17 14 16   CREATININE 1.39* 1.51* 1.24  CALCIUM 8.5* 8.7* 8.5*    Liver Function Tests:  Recent Labs Lab 03/23/17 0518  AST 27  ALT 26  ALKPHOS 50  BILITOT 2.0*  PROT 6.6  ALBUMIN 3.5   No results for input(s): LIPASE, AMYLASE in the last 168  hours. No results for input(s): AMMONIA in the last 168 hours.  CBC:  Recent Labs Lab 03/22/17 2130 03/23/17 0518 03/24/17 0657  WBC 12.0* 11.8* 12.4*  NEUTROABS 9.6*  --   --   HGB 13.4 14.0 13.4  HCT 39.3 41.9 40.4  MCV 88.9 89.5 88.2  PLT 187 202 213    Cardiac Enzymes:  Recent Labs Lab 03/22/17 2130 03/23/17 0135 03/23/17 0518 03/23/17 1357  TROPONINI 1.71* 1.54* 1.43* 1.08*    BNP: BNP (last 3 results)  Recent Labs  03/22/17 2130 03/23/17 0135  BNP 1,213.8* 1,323.8*    ProBNP (last 3 results) No results for input(s): PROBNP in the last 8760 hours.   CBG:  Recent Labs Lab 03/24/17 0754  GLUCAP 145*    Coagulation Studies:  Recent Labs  03/23/17 0518  LABPROT 16.7*  INR 1.34    Other results: EKG: Reviewed personally, Sinus tach 108 bpm  Imaging:  No results found.   Medications:     Current Medications: . aspirin  81 mg Oral Pre-Cath  . aspirin EC  81 mg Oral Daily  . atorvastatin  80 mg Oral q1800  . pantoprazole  40 mg Oral Daily  . sodium chloride flush  3 mL Intravenous Q12H  . sodium chloride flush  3 mL Intravenous Q12H     Infusions: . sodium chloride    . [START ON 03/25/2017] sodium chloride    . heparin 2,100 Units/hr (03/24/17 0134)      Assessment/Plan   1. Acute on chronic systolic CHF - Echo 05/25/30 LVEF 10-15%, Grade 3 DD, Moderate MR, mild/mod RAE.  - NYHA III-IIIb. - Volume status improved. He is no longer orthopneic. Will address diuretic regimen further s/p cath. He has had good urine output so far today, but urine darkening and nearly brown in urinal in room. - Previously thought to  be NICM with negative LHC x 2 in past. With drop in EF since 2011 ? Mixed component vs worsening of his NICM.  - Plan for [facility] this afternoon by Dr. Linard Millers.  - ? OSA component. Will need sleep study.  2. Eleveted troponin - Chest discomfort has improved, but mostly presents as dyspnea - ? Primarily demand ischemia  from his CHD - Lipid panel good with LDL 72 3. AKI - Improved with diuresis. Urine darkening. Await further diuretic adjustment s/p cath. 4. GERD - Continue protonix 5. Hypokalemia - Will supp 6. Hyponatremia - Mild and stable. Likely 2/2 volume overload.   Awaiting cath for ? CAD with chest pain and elevated troponin. Auto-diuresed nearly 1 L so far today (300+ recorded ~ 500 sitting in room to be recorded.) JVP remains elevated. Will not give IV lasix now with imminent cath. Will need to address diurese afterwards.   Length of Stay: 2  Shirley Friar, PA-C  03/24/2017, 12:02 PM  Advanced Heart Failure Team Pager [phone] (M-F; 7a - 4p)  Please contact [facility] for night-coverage after hours (4p -7a ) and weekends on [email or web address]  Patient seen and examined with the above-signed Advanced Practice Provider and/or Housestaff. I personally reviewed laboratory data, imaging studies and relevant notes. I independently examined the patient and formulated the important aspects of the plan. I have edited the note to reflect any of my changes or salient points. I have personally discussed the plan with the patient and/or family.  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 Potentially OSA though snoring has reportedly improved with recent weight loss. Echo images reviewed personally, no obvious evidence of infiltrative process or other suspicious findings. Remains with significant volume overload and suspect CO is somewhat marginal.. Would continue IV lasix.   Natural h/o HF discussed with him and his family. Wait cath this afternoon.  Bensimhon, Daniel MD  Addendum:  Cath results reviewed. No CAD.   Markedly elevated filling pressures with moderately reduced CO. Would continue IV diuresis. Start digoxin, spiro and low-dose losartan. Can transition to [facility] as tolerated. No b-blocker yet.   Glori Bickers, MD  10:33 PM

## 2017-03-24 NOTE — Progress Notes (Signed)
[place] for heparin Indication: NSTEMI  Allergies  Allergen Reactions  . Other Nausea Only and Other (See Comments)    General anesthesia    Patient Measurements: Height: 6' (182.9 cm) Weight: 252 lb 11.2 oz (114.6 kg) IBW/kg (Calculated) : 77.6 Heparin Dosing Weight: 105kg  Vital Signs: Temp: 98.6 F (37 C) (04/27 0523) Temp Source: Oral (04/27 0523) BP: 112/84 (04/27 0523) Pulse Rate: 102 (04/27 0523)  Labs:  Recent Labs  03/22/17 2130 03/23/17 0135  03/23/17 0518 03/23/17 1357 03/23/17 2307 03/24/17 0657  HGB 13.4  --   --  14.0  --   --  13.4  HCT 39.3  --   --  41.9  --   --  40.4  PLT 187  --   --  202  --   --  213  LABPROT  --   --   --  16.7*  --   --   --   INR  --   --   --  1.34  --   --   --   HEPARINUNFRC  --   --   < > <0.10* 0.18* 0.30 0.30  CREATININE 1.39*  --   --  1.51*  --   --  1.24  TROPONINI 1.71* 1.54*  --  1.43* 1.08*  --   --   < > = values in this interval not displayed.   Assessment: 61yo male c/o CP associated w/ dyspnea, troponin elevated, continues on heparin for NSTEMI  Anticoag: none pta - iv hep for r/o ACS HL 0.3 on 2100 units/hr  Renal: Scr 1.24   Heme/Onc: H&H 13.4/40.4, Plt 213  Goal of Therapy:  Heparin level 0.3-0.7 units/ml Monitor platelets by anticoagulation protocol: Yes   Plan:  Continue heparin 2100 units/hr Daily HL, CBC F/U Cards plans for [facility]  Levester Fresh, PharmD, BCPS, BCCCP Clinical Pharmacist Clinical phone for 03/24/2017 from 7a-3:30p: [phone] If after 3:30p, please call main pharmacy at: [phone] 03/24/2017 10:20 AM

## 2017-03-25 DIAGNOSIS — I5043 Acute on chronic combined systolic (congestive) and diastolic (congestive) heart failure: Secondary | ICD-10-CM

## 2017-03-25 LAB — BASIC METABOLIC PANEL
Anion gap: 8 (ref 5–15)
BUN: 18 mg/dL (ref 6–20)
CALCIUM: 8.6 mg/dL — AB (ref 8.9–10.3)
CO2: 28 mmol/L (ref 22–32)
CREATININE: 1.38 mg/dL — AB (ref 0.61–1.24)
Chloride: 97 mmol/L — ABNORMAL LOW (ref 101–111)
GFR, EST NON AFRICAN AMERICAN: 53 mL/min — AB (ref >60)
GLUCOSE: 132 mg/dL — AB (ref 65–99)
Potassium: 3.9 mmol/L (ref 3.5–5.1)
Sodium: 133 mmol/L — ABNORMAL LOW (ref 135–145)

## 2017-03-25 LAB — GLUCOSE, CAPILLARY: Glucose-Capillary: 138 mg/dL — ABNORMAL HIGH (ref 65–99)

## 2017-03-25 LAB — MAGNESIUM: Magnesium: 1.9 mg/dL (ref 1.7–2.4)

## 2017-03-25 MED ORDER — LOSARTAN POTASSIUM 25 MG PO TABS
25.0000 mg | ORAL_TABLET | Freq: Two times a day (BID) | ORAL | Status: DC
  Administered 2017-03-25 – 2017-03-27 (×4): 25 mg via ORAL
  Filled 2017-03-25 (×4): qty 1

## 2017-03-25 MED ORDER — LORAZEPAM 0.5 MG PO TABS
0.5000 mg | ORAL_TABLET | Freq: Two times a day (BID) | ORAL | Status: DC | PRN
  Administered 2017-03-25 – 2017-03-26 (×3): 0.5 mg via ORAL
  Filled 2017-03-25 (×3): qty 1

## 2017-03-25 NOTE — Progress Notes (Signed)
Patient ID: Joshua Raymond, male   DOB: 1956/02/05, 61 y.o.   MRN: [id number]   SUBJECTIVE: Weight down 1 lb with diuresis yesterday.  Only got Lasix once yesterday.  Short of breath walking down hall still but feeling better.   LHC/RHC (4/27):  No CAD Mean RA14 PA 57/33 Mean PCWP 31 CI 1.92  Echo: EF 10-15%, diffuse HK, moderate MR.   Scheduled Meds: . aspirin EC  81 mg Oral Daily  . atorvastatin  80 mg Oral q1800  . digoxin  0.125 mg Oral Daily  . furosemide  40 mg Intravenous BID  . heparin  5,000 Units Subcutaneous Q8H  . Living Better with Heart Failure Book   Does not apply Once  . losartan  25 mg Oral BID  . pantoprazole  40 mg Oral Daily  . potassium chloride  20 mEq Oral BID  . sodium chloride flush  3 mL Intravenous Q12H  . sodium chloride flush  3 mL Intravenous Q12H  . spironolactone  12.5 mg Oral Daily   Continuous Infusions: . sodium chloride     PRN Meds:.sodium chloride, acetaminophen, benzonatate, fluticasone, LORazepam, nitroGLYCERIN, ondansetron (ZOFRAN) IV, oxyCODONE-acetaminophen, sodium chloride flush, zolpidem    Vitals:   03/24/17 2017 03/25/17 0005 03/25/17 0500 03/25/17 0845  BP:  109/77 (!) 109/92   Pulse:  (!) 108 (!) 105 (!) 101  Resp:  19 16   Temp: 97.6 F (36.4 C)  98.8 F (37.1 C)   TempSrc: Oral  Oral   SpO2:  95% 96%   Weight:   251 lb 6.4 oz (114 kg)   Height:        Intake/Output Summary (Last 24 hours) at 03/25/17 1131 Last data filed at 03/25/17 1058  Gross per 24 hour  Intake              479 ml  Output             3025 ml  Net            -2546 ml    LABS: Basic Metabolic Panel:  Recent Labs  03/24/17 0657 03/25/17 0027  NA 130* 133*  K 3.5 3.9  CL 95* 97*  CO2 25 28  GLUCOSE 133* 132*  BUN 16 18  CREATININE 1.24 1.38*  CALCIUM 8.5* 8.6*  MG  --  1.9   Liver Function Tests:  Recent Labs  03/23/17 0518  AST 27  ALT 26  ALKPHOS 50  BILITOT 2.0*  PROT 6.6  ALBUMIN 3.5   No results for input(s):  LIPASE, AMYLASE in the last 72 hours. CBC:  Recent Labs  03/22/17 2130 03/23/17 0518 03/24/17 0657  WBC 12.0* 11.8* 12.4*  NEUTROABS 9.6*  --   --   HGB 13.4 14.0 13.4  HCT 39.3 41.9 40.4  MCV 88.9 89.5 88.2  PLT 187 202 213   Cardiac Enzymes:  Recent Labs  03/23/17 0135 03/23/17 0518 03/23/17 1357  TROPONINI 1.54* 1.43* 1.08*   BNP: Invalid input(s): POCBNP D-Dimer: No results for input(s): DDIMER in the last 72 hours. Hemoglobin A1C:  Recent Labs  03/23/17 0135  HGBA1C 5.9*   Fasting Lipid Panel:  Recent Labs  03/23/17 0518  CHOL 117  HDL 33*  LDLCALC 72  TRIG 62  CHOLHDL 3.5   Thyroid Function Tests:  Recent Labs  03/23/17 0135  TSH 1.095   Anemia Panel: No results for input(s): VITAMINB12, FOLATE, FERRITIN, TIBC, IRON, RETICCTPCT in the last 72 hours.  RADIOLOGY: Dg Chest 2 View  Result Date: 03/22/2017 CLINICAL DATA:  Left upper chest pain x1 week with cough x1 month. Increasing dyspnea. EXAM: CHEST  2 VIEW COMPARISON:  03/21/2017 FINDINGS: Stable cardiomegaly. Aortic atherosclerosis. Small right greater than left pleural effusions with patchy airspace opacity at the right lung base some which may represent compressive atelectasis though adjacent pneumonia is not entirely excluded. Pulmonary vascular congestion is again seen. No acute nor suspicious osseous abnormalities. IMPRESSION: Stable cardiomegaly with mild CHF. Right greater than left small pleural effusions. Patchy airspace opacities at the right lung base cannot exclude adjacent pneumonia though more commonly, this is due to adjacent compressive atelectasis. Electronically Signed   By: Ashley Royalty M.D.   On: 03/22/2017 22:15   Dg Chest 2 View  Result Date: 03/15/2017 CLINICAL DATA:  Persistent cough for 1 month EXAM: CHEST  2 VIEW COMPARISON:  03/08/2010 FINDINGS: Cardiac shadow is mildly enlarged. The lungs are well aerated bilaterally. Mild blunting of the costophrenic angles is seen  consistent with small effusions. No focal infiltrate is noted. No bony abnormality is seen. IMPRESSION: Small bilateral pleural effusions. Electronically Signed   By: Inez Catalina M.D.   On: 03/15/2017 14:44   Dg Abd Acute W/chest  Result Date: 03/21/2017 CLINICAL DATA:  Shortness of breath, generalized pain EXAM: DG ABDOMEN ACUTE W/ 1V CHEST COMPARISON:  03/15/2017 FINDINGS: There is no evidence of dilated bowel loops or free intraperitoneal air. No radiopaque calculi or other significant radiographic abnormality is seen. Trace bilateral pleural effusions. Mild bilateral interstitial thickening. No focal consolidation. Stable cardiomegaly. No acute osseous abnormality. IMPRESSION: Negative abdominal radiographs. Findings concerning for mild CHF. Electronically Signed   By: Kathreen Devoid   On: 03/21/2017 10:24    PHYSICAL EXAM General: NAD Neck: JVP 12-14 cm, no thyromegaly or thyroid nodule.  Lungs: Decreased breath sounds right base.  CV: Nondisplaced PMI.  Heart regular S1/S2, no S3/S4, no murmur.  Trace ankle edema.   Abdomen: Soft, nontender, no hepatosplenomegaly, no distention.  Neurologic: Alert and oriented x 3.  Psych: Normal affect. Extremities: No clubbing or cyanosis.   TELEMETRY: personally reviewed telemetry pt in NSR  ASSESSMENT AND PLAN: 61 yo with history of NICM admitted with acute/chronic systolic CHF and worsening LV function.  1. Acute on chronic systolic CHF: Echo 6/76/19 LVEF 10-15%, Grade 3 DD, Moderate MR, mild/mod RAE. NYHA III-IIIb at home.  Cath this admission with elevated filling pressures, marginal cardiac output, and no coronary disease.  Nonischemic cardiomyopathy.  He has diuresed in hospital and weight is down.  Still volume overloaded on exam.  - Continue Lasix 40 mg IV bid.  - Increase losartan to 25 mg bid, eventual Entresto transition.  - Continue spironolactone 12.5 daily and digoxin 0.125 daily.  - When more euvolemic, add low dose Coreg.  - Narrow  QRS, not CRT candidate.  - Will arrange for cardiac MRI Monday to look for infiltrative disease.   - Check SPEP.  - ? OSA component. Will need sleep study.  2. Eleveted troponin: Demand ischemia related to CHF.  Cath with no CAD.  Can stop atorvastatin.  3. AKI: Resolved, stable creatinine.   Loralie Champagne 03/25/2017 11:37 AM

## 2017-03-26 DIAGNOSIS — I5043 Acute on chronic combined systolic (congestive) and diastolic (congestive) heart failure: Secondary | ICD-10-CM

## 2017-03-26 LAB — BASIC METABOLIC PANEL
ANION GAP: 11 (ref 5–15)
BUN: 20 mg/dL (ref 6–20)
CALCIUM: 9.1 mg/dL (ref 8.9–10.3)
CO2: 23 mmol/L (ref 22–32)
Chloride: 99 mmol/L — ABNORMAL LOW (ref 101–111)
Creatinine, Ser: 1.11 mg/dL (ref 0.61–1.24)
GFR calc Af Amer: >60 mL/min (ref >60)
GFR calc non Af Amer: >60 mL/min (ref >60)
GLUCOSE: 116 mg/dL — AB (ref 65–99)
Potassium: 4.3 mmol/L (ref 3.5–5.1)
Sodium: 133 mmol/L — ABNORMAL LOW (ref 135–145)

## 2017-03-26 LAB — CBC
HEMATOCRIT: 43.2 % (ref 39.0–52.0)
HEMOGLOBIN: 14.5 g/dL (ref 13.0–17.0)
MCH: 29.8 pg (ref 26.0–34.0)
MCHC: 33.6 g/dL (ref 30.0–36.0)
MCV: 88.9 fL (ref 78.0–100.0)
Platelets: 255 10*3/uL (ref 150–400)
RBC: 4.86 MIL/uL (ref 4.22–5.81)
RDW: 13.2 % (ref 11.5–15.5)
WBC: 9 10*3/uL (ref 4.0–10.5)

## 2017-03-26 LAB — GLUCOSE, CAPILLARY: Glucose-Capillary: 142 mg/dL — ABNORMAL HIGH (ref 65–99)

## 2017-03-26 MED ORDER — CARVEDILOL 3.125 MG PO TABS
3.1250 mg | ORAL_TABLET | Freq: Two times a day (BID) | ORAL | Status: DC
  Administered 2017-03-26 – 2017-03-27 (×2): 3.125 mg via ORAL
  Filled 2017-03-26 (×2): qty 1

## 2017-03-26 MED ORDER — CARVEDILOL 3.125 MG PO TABS: 3.1250 mg | ORAL_TABLET | Freq: Two times a day (BID) | ORAL | Status: DC

## 2017-03-26 MED ORDER — ALUM & MAG HYDROXIDE-SIMETH 200-200-20 MG/5ML PO SUSP
30.0000 mL | ORAL | Status: DC | PRN
  Administered 2017-03-26: 30 mL via ORAL
  Filled 2017-03-26: qty 30

## 2017-03-26 NOTE — Plan of Care (Signed)
Problem: Safety: Goal: Ability to remain free from injury will improve Outcome: Completed/Met Date Met: 03/26/17 Patient ambulates indepent in the room, uses call bell for any additional assistance.

## 2017-03-26 NOTE — Progress Notes (Signed)
Patient ID: Joshua Raymond, male   DOB: [birth year], 61 y.o.   MRN: [id number]   SUBJECTIVE: Weight down 6 lbs with diuresis yesterday.  Short of breath walking yesterday, better today.  Had some nausea this morning.  LHC/RHC (4/27):  No CAD Mean RA14 PA 57/33 Mean PCWP 31 CI 1.92  Echo: EF 10-15%, diffuse HK, moderate MR.   Scheduled Meds: . aspirin EC  81 mg Oral Daily  . atorvastatin  80 mg Oral q1800  . carvedilol  3.125 mg Oral BID WC  . digoxin  0.125 mg Oral Daily  . furosemide  40 mg Intravenous BID  . heparin  5,000 Units Subcutaneous Q8H  . Living Better with Heart Failure Book   Does not apply Once  . losartan  25 mg Oral BID  . pantoprazole  40 mg Oral Daily  . potassium chloride  20 mEq Oral BID  . sodium chloride flush  3 mL Intravenous Q12H  . sodium chloride flush  3 mL Intravenous Q12H  . spironolactone  12.5 mg Oral Daily   Continuous Infusions: . sodium chloride     PRN Meds:.sodium chloride, acetaminophen, alum & mag hydroxide-simeth, benzonatate, fluticasone, LORazepam, nitroGLYCERIN, ondansetron (ZOFRAN) IV, oxyCODONE-acetaminophen, sodium chloride flush, zolpidem    Vitals:   03/25/17 1330 03/25/17 1900 03/26/17 0500 03/26/17 0913  BP: 106/82 (!) 86/66 122/69   Pulse: 99 93 96 96  Resp: 18 19    Temp: 98.1 F (36.7 C) 98.2 F (36.8 C) 97.9 F (36.6 C)   TempSrc: Oral Oral Oral   SpO2: 96% 97% 97%   Weight:   245 lb 8 oz (111.4 kg)   Height:        Intake/Output Summary (Last 24 hours) at 03/26/17 1002 Last data filed at 03/26/17 0915  Gross per 24 hour  Intake              825 ml  Output             2475 ml  Net            -1650 ml    LABS: Basic Metabolic Panel:  Recent Labs  03/25/17 0027 03/26/17 0428  NA 133* 133*  K 3.9 4.3  CL 97* 99*  CO2 28 23  GLUCOSE 132* 116*  BUN 18 20  CREATININE 1.38* 1.11  CALCIUM 8.6* 9.1  MG 1.9  --    Liver Function Tests: No results for input(s): AST, ALT, ALKPHOS, BILITOT, PROT,  ALBUMIN in the last 72 hours. No results for input(s): LIPASE, AMYLASE in the last 72 hours. CBC:  Recent Labs  03/24/17 0657 03/26/17 0428  WBC 12.4* 9.0  HGB 13.4 14.5  HCT 40.4 43.2  MCV 88.2 88.9  PLT 213 255   Cardiac Enzymes:  Recent Labs  03/23/17 1357  TROPONINI 1.08*   BNP: Invalid input(s): POCBNP D-Dimer: No results for input(s): DDIMER in the last 72 hours. Hemoglobin A1C: No results for input(s): HGBA1C in the last 72 hours. Fasting Lipid Panel: No results for input(s): CHOL, HDL, LDLCALC, TRIG, CHOLHDL, LDLDIRECT in the last 72 hours. Thyroid Function Tests: No results for input(s): TSH, T4TOTAL, T3FREE, THYROIDAB in the last 72 hours.  Invalid input(s): FREET3 Anemia Panel: No results for input(s): VITAMINB12, FOLATE, FERRITIN, TIBC, IRON, RETICCTPCT in the last 72 hours.  RADIOLOGY: Dg Chest 2 View  Result Date: 03/22/2017 CLINICAL DATA:  Left upper chest pain x1 week with cough x1 month. Increasing dyspnea. EXAM: CHEST  2 VIEW COMPARISON:  03/21/2017 FINDINGS: Stable cardiomegaly. Aortic atherosclerosis. Small right greater than left pleural effusions with patchy airspace opacity at the right lung base some which may represent compressive atelectasis though adjacent pneumonia is not entirely excluded. Pulmonary vascular congestion is again seen. No acute nor suspicious osseous abnormalities. IMPRESSION: Stable cardiomegaly with mild CHF. Right greater than left small pleural effusions. Patchy airspace opacities at the right lung base cannot exclude adjacent pneumonia though more commonly, this is due to adjacent compressive atelectasis. Electronically Signed   By: Ashley Royalty M.D.   On: 03/22/2017 22:15   Dg Chest 2 View  Result Date: 03/15/2017 CLINICAL DATA:  Persistent cough for 1 month EXAM: CHEST  2 VIEW COMPARISON:  03/08/2010 FINDINGS: Cardiac shadow is mildly enlarged. The lungs are well aerated bilaterally. Mild blunting of the costophrenic angles  is seen consistent with small effusions. No focal infiltrate is noted. No bony abnormality is seen. IMPRESSION: Small bilateral pleural effusions. Electronically Signed   By: Inez Catalina M.D.   On: 03/15/2017 14:44   Dg Abd Acute W/chest  Result Date: 03/21/2017 CLINICAL DATA:  Shortness of breath, generalized pain EXAM: DG ABDOMEN ACUTE W/ 1V CHEST COMPARISON:  03/15/2017 FINDINGS: There is no evidence of dilated bowel loops or free intraperitoneal air. No radiopaque calculi or other significant radiographic abnormality is seen. Trace bilateral pleural effusions. Mild bilateral interstitial thickening. No focal consolidation. Stable cardiomegaly. No acute osseous abnormality. IMPRESSION: Negative abdominal radiographs. Findings concerning for mild CHF. Electronically Signed   By: Kathreen Devoid   On: 03/21/2017 10:24    PHYSICAL EXAM General: NAD Neck: JVP 9-10 cm, no thyromegaly or thyroid nodule.  Lungs: Decreased breath sounds right base, otherwise clear.  CV: Nondisplaced PMI.  Heart regular S1/S2, no S3/S4, no murmur.  Trace ankle edema.   Abdomen: Soft, nontender, no hepatosplenomegaly, no distention.  Neurologic: Alert and oriented x 3.  Psych: Normal affect. Extremities: No clubbing or cyanosis.   TELEMETRY: personally reviewed telemetry pt in NSR  ASSESSMENT AND PLAN: 61 yo with history of NICM admitted with acute/chronic systolic CHF and worsening LV function.  1. Acute on chronic systolic CHF: Echo 9/60/45 LVEF 10-15%, Grade 3 DD, Moderate MR, mild/mod RAE. NYHA III-IIIb at home.  Cath this admission with elevated filling pressures, marginal cardiac output, and no coronary disease.  Nonischemic cardiomyopathy.  He has diuresed in hospital and weight is down.  Think he still has a bit of fluid on him but improving.   - Continue Lasix 40 mg IV bid today, probably to po tomorrow.  - Continue losartan 25 mg bid, eventual Entresto transition.  - Continue spironolactone 12.5 daily and  digoxin 0.125 daily.  - Will add low dose Coreg with improved volume status.  - Narrow QRS, not CRT candidate.  - Will arrange for cardiac MRI Monday to look for infiltrative disease.   - Check SPEP.  - ? OSA component. Will need sleep study.  2. Eleveted troponin: Demand ischemia related to CHF.  Cath with no CAD.  Can stop atorvastatin.  3. AKI: Resolved, stable creatinine.   Loralie Champagne 03/26/2017 10:02 AM

## 2017-03-27 ENCOUNTER — Other Ambulatory Visit (HOSPITAL_COMMUNITY): Payer: No Typology Code available for payment source

## 2017-03-27 ENCOUNTER — Inpatient Hospital Stay (HOSPITAL_COMMUNITY): Payer: No Typology Code available for payment source

## 2017-03-27 ENCOUNTER — Encounter (HOSPITAL_COMMUNITY): Payer: Self-pay | Admitting: Interventional Cardiology

## 2017-03-27 DIAGNOSIS — I429 Cardiomyopathy, unspecified: Secondary | ICD-10-CM

## 2017-03-27 LAB — GLUCOSE, CAPILLARY: GLUCOSE-CAPILLARY: 128 mg/dL — AB (ref 65–99)

## 2017-03-27 LAB — BASIC METABOLIC PANEL
Anion gap: 10 (ref 5–15)
BUN: 23 mg/dL — AB (ref 6–20)
CHLORIDE: 97 mmol/L — AB (ref 101–111)
CO2: 28 mmol/L (ref 22–32)
Calcium: 9.1 mg/dL (ref 8.9–10.3)
Creatinine, Ser: 1.36 mg/dL — ABNORMAL HIGH (ref 0.61–1.24)
GFR calc Af Amer: >60 mL/min (ref >60)
GFR calc non Af Amer: 54 mL/min — ABNORMAL LOW (ref >60)
Glucose, Bld: 130 mg/dL — ABNORMAL HIGH (ref 65–99)
Potassium: 4.6 mmol/L (ref 3.5–5.1)
SODIUM: 135 mmol/L (ref 135–145)

## 2017-03-27 LAB — PROTEIN ELECTROPHORESIS, SERUM
A/G Ratio: 0.9 (ref 0.7–1.7)
ALPHA-1-GLOBULIN: 0.4 g/dL (ref 0.0–0.4)
Albumin ELP: 3.2 g/dL (ref 2.9–4.4)
Alpha-2-Globulin: 1.1 g/dL — ABNORMAL HIGH (ref 0.4–1.0)
BETA GLOBULIN: 1.2 g/dL (ref 0.7–1.3)
GAMMA GLOBULIN: 0.8 g/dL (ref 0.4–1.8)
Globulin, Total: 3.5 g/dL (ref 2.2–3.9)
Total Protein ELP: 6.7 g/dL (ref 6.0–8.5)

## 2017-03-27 MED ORDER — SPIRONOLACTONE 25 MG PO TABS: 25.0000 mg | ORAL_TABLET | Freq: Every day | ORAL | Status: DC

## 2017-03-27 MED ORDER — SPIRONOLACTONE 25 MG PO TABS
12.5000 mg | ORAL_TABLET | Freq: Once | ORAL | Status: AC
  Administered 2017-03-27: 12.5 mg via ORAL
  Filled 2017-03-27: qty 1

## 2017-03-27 MED ORDER — FUROSEMIDE 40 MG PO TABS: 40.0000 mg | ORAL_TABLET | Freq: Every day | ORAL | Status: DC

## 2017-03-27 MED ORDER — LOSARTAN POTASSIUM 25 MG PO TABS: 25.0000 mg | ORAL_TABLET | Freq: Two times a day (BID) | ORAL | 6 refills | Status: DC

## 2017-03-27 MED ORDER — DIGOXIN 125 MCG PO TABS: 0.1250 mg | ORAL_TABLET | Freq: Every day | ORAL | 6 refills | Status: DC

## 2017-03-27 MED ORDER — PANTOPRAZOLE SODIUM 40 MG PO TBEC: 40.0000 mg | DELAYED_RELEASE_TABLET | Freq: Every day | ORAL | 6 refills | Status: DC

## 2017-03-27 MED ORDER — FUROSEMIDE 40 MG PO TABS: 40.0000 mg | ORAL_TABLET | Freq: Every day | ORAL | 6 refills | Status: DC

## 2017-03-27 MED ORDER — GADOBENATE DIMEGLUMINE 529 MG/ML IV SOLN
40.0000 mL | Freq: Once | INTRAVENOUS | Status: AC
  Administered 2017-03-27: 38 mL via INTRAVENOUS

## 2017-03-27 MED ORDER — CARVEDILOL 3.125 MG PO TABS: 3.1250 mg | ORAL_TABLET | Freq: Two times a day (BID) | ORAL | 6 refills | Status: DC

## 2017-03-27 MED ORDER — SPIRONOLACTONE 25 MG PO TABS: 25.0000 mg | ORAL_TABLET | Freq: Every day | ORAL | 6 refills | Status: DC

## 2017-03-27 NOTE — Progress Notes (Addendum)
The patient has been given his discharge instructions and a new medication list along with what to take today. He has prescriptions to pick up. Has heart failure education to take home and advised to weigh daily. He has femoral and radial heart cath site education. He is leaving with has wife and daughter via car.   Saddie Benders RN

## 2017-03-27 NOTE — Discharge Summary (Signed)
Advanced Heart Failure Discharge Note  Discharge Summary   Patient ID: Joshua Raymond MRN: [id number], DOB/AGE: 03-30-1956 61 y.o. Admit date: 03/22/2017 D/C date:     03/27/2017   Primary Discharge Diagnoses:  1. Acute on chronic combined systolic and diastolic CHF 2. Elevated troponin 3. Acute kidney injury 4. Hypertension  Hospital Course: Joshua Raymond is a 61 year old male with a past medical history of HTN, GERD, and chronic systolic CHF. He presented to [facility] with RUQ pain and acute CHF with mildly elevated troponin. Pertinent labs on admission include Troponin 1.71, Creatinine 1.39, K 3.6, BNP 1322. Admission weight was 260 pounds.   Previous cath in 2011 showed no obstructive CAD and EF 40-45%. Has not had CHF therapy or follow up since then. Pt has had 2 caths. 1st was due to chest pain from esophageal spasm, and 2011 had + stress test after a period of malaise so proceeded to cath, which was normal.   Pt states he has had nausea and indigestion for past six months. Workup up thus far including EGD/Colon and CT has been negative. Over past month has been SOB with minimal exertion. Seen by PCP last week and earlier this week with CXR + for pulmonary edema. Started on lasix with minimal relief. Pt states at his baseline he was walking 45 minutes on TM or elliptical without difficulty. Over past month, and especially past week has been SOB ambulating around the house. Denies SOB with changing clothes or bathing. + bendopnea, orthopnea, and PND. Mild peripheral edema at worst. Stopped smoking 34 years ago with approx 15 pack year history. Drinks 4-5 alcoholic beverages a week ( usually one daily at most). Denies drug use.  He states his father passed away at 19 yo from MI (was heavy smoker)  Echo on 03/24/17 with EF of 84-69%, grade 3 diastolic dysfunction, moderate MR, mild to moderate RAE. Right and left heart cath on 03/24/17 with normal cors, fick cardiac output/index 4.52/1.92. LVEDP 31. Full  results below. SPEP pending. 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 There was some question of LV thrombus but after further review and review of his Echo with contrast it was felt that there was no LV thrombus, rather trabeculation adjacent to an area of LGE. Therefore, there is no need for anticoagulation.   Discharge weight was 244 pounds. He was discharged on losartan with plans to switch to [facility] outpatient (his BP was too soft at discharge to switch). He will need a sleep study outpatient to assess for OSA, highly likely considering he snores and is overweight. Started on digoxin 0.125 mcg daily, spiro 25 mg, Coreg 3.125 mg, and lasix 40mg  daily.   We will follow up with Joshua Raymond next week in the CHF clinic. He was seen today by Dr. Aundra Dubin and deemed suitable for discharge.   Discharge Weight: 244 pounds.   Discharge Vitals: Blood pressure (!) 97/53, pulse 78, temperature 97.9 F (36.6 C), temperature source Oral, resp. rate 19, height 6' (1.829 m), weight 244 lb 8 oz (110.9 kg), SpO2 99 %.  Labs: Lab Results  Component Value Date   WBC 9.0 03/26/2017   HGB 14.5 03/26/2017   HCT 43.2 03/26/2017   MCV 88.9 03/26/2017   PLT 255 03/26/2017    Recent Labs Lab 03/23/17 0518  03/27/17 0241  NA 135  < > 135  K 3.3*  < >  4.6  CL 95*  < > 97*  CO2 29  < > 28  BUN 14  < > 23*  CREATININE 1.51*  < > 1.36*  CALCIUM 8.7*  < > 9.1  PROT 6.6  --   --   BILITOT 2.0*  --   --   ALKPHOS 50  --   --   ALT 26  --   --   AST 27  --   --   GLUCOSE 119*  < > 130*  < > = values in this interval not displayed. Lab Results  Component Value Date   CHOL 117 03/23/2017   HDL 33 (L) 03/23/2017   LDLCALC 72 03/23/2017   TRIG 62 03/23/2017   BNP (last 3 results)  Recent Labs  03/22/17 2130 03/23/17 0135  BNP 1,213.8* 1,323.8*     Diagnostic Studies/Procedures    Transthoracic Echocardiography 03/24/17 Study Conclusions  - Left ventricle: The cavity size was severely dilated. Systolic   function was severely reduced. The estimated ejection fraction   was in the range of 10% to 15%. Diffuse hypokinesis. Severe   hypokinesis of the entire myocardium. Akinesis of the apical   myocardium. Doppler parameters are consistent with a reversible   restrictive pattern, indicative of decreased left ventricular   diastolic compliance and/or increased left atrial pressure (grade   3 diastolic dysfunction). - Mitral valve: There was moderate regurgitation. - Right atrium: The atrium was mildly to moderately dilated.   Right/Left Heart Cath and Coronary Angiography 03/24/17  Normal coronary arteries.  Moderate pulmonary hypertension.  Previously documented severe left ventricular systolic function with EF less than 20%. Severely elevated LVEDP and pulmonary capillary wedge pressure, 31 mmHg.  Findings are compatible with a nonischemic cardiomyopathy, producing acute on chronic systolic heart failure.  RECOMMENDATIONS:   Guideline mandated heart failure therapy.  RHC Fick Cardiac Output 4.52 L/min  Fick Cardiac Output Index 1.92 (L/min)/BSA  RA A Wave 18 mmHg  RA V Wave 15 mmHg  RA Mean 14 mmHg  RV Systolic Pressure 53 mmHg  RV Diastolic Pressure 14 mmHg  RV EDP 20 mmHg  PA Systolic Pressure 57 mmHg  PA Diastolic Pressure 33 mmHg  PA Mean 42 mmHg  PW A Wave 30 mmHg  PW V Wave 32 mmHg  PW Mean 31 mmHg  AO Systolic Pressure 767 mmHg  AO Diastolic Pressure 78 mmHg  AO Mean 91 mmHg  LV Systolic Pressure 341 mmHg  LV Diastolic Pressure 22 mmHg  LV EDP 31 mmHg  Arterial Occlusion Pressure Extended Systolic Pressure 937 mmHg  Arterial Occlusion Pressure Extended Diastolic Pressure 77 mmHg  Arterial Occlusion Pressure Extended Mean Pressure 89 mmHg  Left Ventricular Apex Extended Systolic Pressure 902 mmHg  Left Ventricular Apex Extended  Diastolic Pressure 19 mmHg  Left Ventricular Apex Extended EDP Pressure 25 mmHg  QP/QS 1  TPVR Index 21.84 HRUI  TSVR Index 47.31 HRUI  PVR SVR Ratio 0.14  TPVR/TSVR Ratio 0.46     Discharge Medications   Allergies as of 03/27/2017      Reactions   Other Nausea Only, Other (See Comments)   General anesthesia      Medication List    STOP taking these medications   Esomeprazole Magnesium 20 MG Tbec Replaced by:  pantoprazole 40 MG tablet   omeprazole 20 MG capsule Commonly known as:  PRILOSEC     TAKE these medications   aspirin EC 81 MG tablet Take 81 mg by mouth daily.  carvedilol 3.125 MG tablet Commonly known as:  COREG Take 1 tablet (3.125 mg total) by mouth 2 (two) times daily with a meal.   cetirizine 10 MG tablet Commonly known as:  ZYRTEC Take 10 mg by mouth daily.   digoxin 0.125 MG tablet Commonly known as:  LANOXIN Take 1 tablet (0.125 mg total) by mouth daily. Start taking on:  03/28/2017   fluticasone 50 MCG/ACT nasal spray Commonly known as:  FLONASE Place 1-2 sprays into both nostrils daily.   furosemide 40 MG tablet Commonly known as:  LASIX Take 1 tablet (40 mg total) by mouth daily. Start taking on:  03/28/2017 What changed:  medication strength   GINGER PO Take 1 tablet by mouth daily as needed (indigestion).   losartan 25 MG tablet Commonly known as:  COZAAR Take 1 tablet (25 mg total) by mouth 2 (two) times daily.   multivitamin with minerals Tabs tablet Take 2 tablets by mouth daily.   ondansetron 8 MG tablet Commonly known as:  ZOFRAN Take 8 mg by mouth 2 (two) times daily as needed for nausea or vomiting.   pantoprazole 40 MG tablet Commonly known as:  PROTONIX Take 1 tablet (40 mg total) by mouth daily. Start taking on:  03/28/2017 Replaces:  Esomeprazole Magnesium 20 MG Tbec   spironolactone 25 MG tablet Commonly known as:  ALDACTONE Take 1 tablet (25 mg total) by mouth daily. Start taking on:  03/28/2017        Disposition   The patient will be discharged in stable condition to home. Discharge Instructions    (HEART FAILURE PATIENTS) Call MD:  Anytime you have any of the following symptoms: 1) 3 pound weight gain in 24 hours or 5 pounds in 1 week 2) shortness of breath, with or without a dry hacking cough 3) swelling in the hands, feet or stomach 4) if you have to sleep on extra pillows at night in order to breathe.    Complete by:  As directed    Diet - low sodium heart healthy    Complete by:  As directed    Heart Failure patients record your daily weight using the same scale at the same time of day    Complete by:  As directed    Increase activity slowly    Complete by:  As directed    STOP any activity that causes chest pain, shortness of breath, dizziness, sweating, or exessive weakness    Complete by:  As directed      Follow-up Information    Darrick Grinder, NP Follow up on 04/03/2017.   Specialty:  Cardiology Why:  at 2:30 pm for hospital follow up with the CHF clinic. Garage code [id number]. Leisure centre manager through [employer] [facility] [address].  Can also park in lower ED lot and enter through [employer].  Contact information: [address] [place] [address] [phone]             Duration of Discharge Encounter: Greater than 35 minutes   Signed, Arbutus Leas, NP 03/27/2017, 2:41 PM

## 2017-03-27 NOTE — Progress Notes (Signed)
CARDIAC REHAB PHASE I   PRE:  Rate/Rhythm: 95 SR  BP:  Sitting: 105/77        SaO2: 97 RA  MODE:  Ambulation: 550 ft   POST:  Rate/Rhythm: 113 ST  BP:  Sitting: 125/83         SaO2: 97 RA  Pt ambulated 550 ft on RA, independent, steady gait, tolerated well. Pt c/o mild DOE, states it is much improved, denies any other complaints, declined rest stop. Encouraged additional ambulation as tolerated. Pt to chair after walk, call bell within reach. Will follow.   [id number] Lenna Sciara, RN, BSN 03/27/2017 11:17 AM

## 2017-03-27 NOTE — Progress Notes (Addendum)
Patient ID: Joshua Raymond, male   DOB: [birth year], 61 y.o.   MRN: [id number]   SUBJECTIVE:   Feeling good this am. Feels like he still has good urine output.  SOB continues to improve. Denies orthopnea or PND.   Out 250 cc yesterday and weight down 1 more lb.   LHC/RHC (4/27):  No CAD Mean RA14 PA 57/33 Mean PCWP 31 CI 1.92  Echo: EF 10-15%, diffuse HK, moderate MR.   Scheduled Meds: . aspirin EC  81 mg Oral Daily  . carvedilol  3.125 mg Oral BID WC  . digoxin  0.125 mg Oral Daily  . furosemide  40 mg Intravenous BID  . heparin  5,000 Units Subcutaneous Q8H  . Living Better with Heart Failure Book   Does not apply Once  . losartan  25 mg Oral BID  . pantoprazole  40 mg Oral Daily  . potassium chloride  20 mEq Oral BID  . sodium chloride flush  3 mL Intravenous Q12H  . sodium chloride flush  3 mL Intravenous Q12H  . spironolactone  12.5 mg Oral Daily   Continuous Infusions: . sodium chloride     PRN Meds:.sodium chloride, acetaminophen, alum & mag hydroxide-simeth, benzonatate, fluticasone, LORazepam, nitroGLYCERIN, ondansetron (ZOFRAN) IV, oxyCODONE-acetaminophen, sodium chloride flush, zolpidem   Vitals:   03/26/17 1551 03/26/17 1742 03/26/17 1900 03/27/17 0300  BP: 97/60 123/78 114/62 107/65  Pulse: 91 92 85 98  Resp: 18  17 19   Temp: 97.7 F (36.5 C)  97.2 F (36.2 C) 97.5 F (36.4 C)  TempSrc: Oral  Oral Oral  SpO2:   96% 98%  Weight:    244 lb 8 oz (110.9 kg)  Height:        Intake/Output Summary (Last 24 hours) at 03/27/17 0740 Last data filed at 03/27/17 0300  Gross per 24 hour  Intake              948 ml  Output             1200 ml  Net             -252 ml    LABS: Basic Metabolic Panel:  Recent Labs  03/25/17 0027 03/26/17 0428 03/27/17 0241  NA 133* 133* 135  K 3.9 4.3 4.6  CL 97* 99* 97*  CO2 28 23 28   GLUCOSE 132* 116* 130*  BUN 18 20 23*  CREATININE 1.38* 1.11 1.36*  CALCIUM 8.6* 9.1 9.1  MG 1.9  --   --    Liver Function  Tests: No results for input(s): AST, ALT, ALKPHOS, BILITOT, PROT, ALBUMIN in the last 72 hours. No results for input(s): LIPASE, AMYLASE in the last 72 hours. CBC:  Recent Labs  03/26/17 0428  WBC 9.0  HGB 14.5  HCT 43.2  MCV 88.9  PLT 255   Cardiac Enzymes: No results for input(s): CKTOTAL, CKMB, CKMBINDEX, TROPONINI in the last 72 hours. BNP: Invalid input(s): POCBNP D-Dimer: No results for input(s): DDIMER in the last 72 hours. Hemoglobin A1C: No results for input(s): HGBA1C in the last 72 hours. Fasting Lipid Panel: No results for input(s): CHOL, HDL, LDLCALC, TRIG, CHOLHDL, LDLDIRECT in the last 72 hours. Thyroid Function Tests: No results for input(s): TSH, T4TOTAL, T3FREE, THYROIDAB in the last 72 hours.  Invalid input(s): FREET3 Anemia Panel: No results for input(s): VITAMINB12, FOLATE, FERRITIN, TIBC, IRON, RETICCTPCT in the last 72 hours.  RADIOLOGY: Dg Chest 2 View  Result Date: 03/22/2017 CLINICAL DATA:  Left  upper chest pain x1 week with cough x1 month. Increasing dyspnea. EXAM: CHEST  2 VIEW COMPARISON:  03/21/2017 FINDINGS: Stable cardiomegaly. Aortic atherosclerosis. Small right greater than left pleural effusions with patchy airspace opacity at the right lung base some which may represent compressive atelectasis though adjacent pneumonia is not entirely excluded. Pulmonary vascular congestion is again seen. No acute nor suspicious osseous abnormalities. IMPRESSION: Stable cardiomegaly with mild CHF. Right greater than left small pleural effusions. Patchy airspace opacities at the right lung base cannot exclude adjacent pneumonia though more commonly, this is due to adjacent compressive atelectasis. Electronically Signed   By: Ashley Royalty M.D.   On: 03/22/2017 22:15   Dg Chest 2 View  Result Date: 03/15/2017 CLINICAL DATA:  Persistent cough for 1 month EXAM: CHEST  2 VIEW COMPARISON:  03/08/2010 FINDINGS: Cardiac shadow is mildly enlarged. The lungs are well  aerated bilaterally. Mild blunting of the costophrenic angles is seen consistent with small effusions. No focal infiltrate is noted. No bony abnormality is seen. IMPRESSION: Small bilateral pleural effusions. Electronically Signed   By: Inez Catalina M.D.   On: 03/15/2017 14:44   Dg Abd Acute W/chest  Result Date: 03/21/2017 CLINICAL DATA:  Shortness of breath, generalized pain EXAM: DG ABDOMEN ACUTE W/ 1V CHEST COMPARISON:  03/15/2017 FINDINGS: There is no evidence of dilated bowel loops or free intraperitoneal air. No radiopaque calculi or other significant radiographic abnormality is seen. Trace bilateral pleural effusions. Mild bilateral interstitial thickening. No focal consolidation. Stable cardiomegaly. No acute osseous abnormality. IMPRESSION: Negative abdominal radiographs. Findings concerning for mild CHF. Electronically Signed   By: Kathreen Devoid   On: 03/21/2017 10:24    PHYSICAL EXAM General: Well appearing. No resp difficulty. HEENT: normal Neck: supple. JVD 8-9 cm. Carotids 2+ bilat; no bruits. No thyromegaly or nodule noted. Cor: PMI nondisplaced. RRR, No M/G/R noted Lungs: CTAB, normal effort. Abdomen: soft, non-tender, distended, no HSM. No bruits or masses. +BS  Extremities: no cyanosis, clubbing, rash, R and LLE no edema.  Neuro: alert & orientedx3, cranial nerves grossly intact. moves all 4 extremities w/o difficulty. Affect pleasant    TELEMETRY: Personally reviewed, NSR 80-90s  ASSESSMENT AND PLAN: 61 yo with history of NICM admitted with acute/chronic systolic CHF and worsening LV function.  1. Acute on chronic systolic CHF: Echo 3/81/01 LVEF 10-15%, Grade 3 DD, Moderate MR, mild/mod RAE. NYHA III-IIIb at home.  Cath this admission with elevated filling pressures, marginal cardiac output, and no coronary disease.  Nonischemic cardiomyopathy.   - Volume status improved. He has diuresed 8.3 L and down 16 lbs from admission.  - Will give 40 mg IV lasix this am, and then  transition to po. Pt had started lasix 40 mg daily the day prior to admission, and was not on previously.  This may be an OK dose for him.  - Continue losartan 25 mg bid, eventual Entresto transition. Pressure on softer side this am so will not switch.   - Continue spironolactone 12.5 daily. K trending up. Will hold potassium supp today and follow.  - Continue digoxin 0.125 daily, will need level followed as outpatient.  - Continue coreg 3.125 mg BID  - Narrow QRS, not CRT candidate.  - Plan for cardiac MRI today.  - SPEP ordered.  - ? OSA component. Will need sleep study as outpatient.  2. Eleveted troponin:  - Cath with no CAD. Likely 2/2 demand ischemia related to CHF.  3. AKI:  - Creatinine relatively stable with ongoing  diuresis.   Plan for cMRI today. IV lasix this am at least.. Likely home in next 24-48 hours.   Shirley Friar, PA-C  03/27/2017 7:40 AM  Advanced Heart Failure Team Pager [phone] (M-F; 7a - 4p)  Please contact [facility] for night-coverage after hours (4p -7a ) and weekends on [email or web address]  Patient seen with PA, agree with the above note.   Preliminary review of cardiac MRI: EF 15% range, severe LV dilation, concern for possible small LV thrombus at apex but not definite (will need to review with colleagues).  LGE pattern with patchy, mainly mid-wall LGE may be consistent with myocarditis.  Will also review echo for LV thrombus, may need repeat limited echo with contrast.   1 dose IV Lasix today then transition to po, looks euvolemic.   Increase spironolactone to 25 mg daily today.   Probably home tomorrow, need to work out whether or not he needs to be anticoagulated.   Loralie Champagne 03/27/2017 1:24 PM   I reviewed the cardiac MRI with Dr. Johnsie Cancel and looked at his echo with [facility] from 4/27.  I do not think he has a thrombus.  I think this was trabeculation adjacent to an area of LGE.   Loralie Champagne 03/27/2017 1:37 PM

## 2017-03-28 ENCOUNTER — Ambulatory Visit (HOSPITAL_COMMUNITY): Payer: No Typology Code available for payment source

## 2017-04-03 ENCOUNTER — Ambulatory Visit (HOSPITAL_COMMUNITY)
Admission: RE | Admit: 2017-04-03 | Discharge: 2017-04-03 | Disposition: A | Discharge status: Home / Self Care | Payer: No Typology Code available for payment source | Source: Ambulatory Visit | Attending: Internal Medicine | Admitting: Internal Medicine

## 2017-04-03 ENCOUNTER — Encounter (HOSPITAL_COMMUNITY): Payer: Self-pay

## 2017-04-03 VITALS — BP 117/74 | HR 84 | Wt 241.5 lb

## 2017-04-03 DIAGNOSIS — I2510 Atherosclerotic heart disease of native coronary artery without angina pectoris: Secondary | ICD-10-CM | POA: Diagnosis not present

## 2017-04-03 DIAGNOSIS — R29818 Other symptoms and signs involving the nervous system: Secondary | ICD-10-CM

## 2017-04-03 DIAGNOSIS — Z87891 Personal history of nicotine dependence: Secondary | ICD-10-CM | POA: Diagnosis not present

## 2017-04-03 DIAGNOSIS — Z8249 Family history of ischemic heart disease and other diseases of the circulatory system: Secondary | ICD-10-CM | POA: Insufficient documentation

## 2017-04-03 DIAGNOSIS — Z7982 Long term (current) use of aspirin: Secondary | ICD-10-CM | POA: Insufficient documentation

## 2017-04-03 DIAGNOSIS — I429 Cardiomyopathy, unspecified: Secondary | ICD-10-CM | POA: Diagnosis not present

## 2017-04-03 DIAGNOSIS — I110 Hypertensive heart disease with heart failure: Secondary | ICD-10-CM | POA: Insufficient documentation

## 2017-04-03 DIAGNOSIS — N179 Acute kidney failure, unspecified: Secondary | ICD-10-CM | POA: Diagnosis not present

## 2017-04-03 DIAGNOSIS — K219 Gastro-esophageal reflux disease without esophagitis: Secondary | ICD-10-CM | POA: Insufficient documentation

## 2017-04-03 DIAGNOSIS — Z8041 Family history of malignant neoplasm of ovary: Secondary | ICD-10-CM | POA: Insufficient documentation

## 2017-04-03 DIAGNOSIS — I428 Other cardiomyopathies: Secondary | ICD-10-CM

## 2017-04-03 DIAGNOSIS — I5042 Chronic combined systolic (congestive) and diastolic (congestive) heart failure: Secondary | ICD-10-CM | POA: Diagnosis not present

## 2017-04-03 DIAGNOSIS — I5022 Chronic systolic (congestive) heart failure: Secondary | ICD-10-CM | POA: Insufficient documentation

## 2017-04-03 LAB — BASIC METABOLIC PANEL
ANION GAP: 12 (ref 5–15)
BUN: 35 mg/dL — ABNORMAL HIGH (ref 6–20)
CO2: 23 mmol/L (ref 22–32)
Calcium: 9.2 mg/dL (ref 8.9–10.3)
Chloride: 97 mmol/L — ABNORMAL LOW (ref 101–111)
Creatinine, Ser: 1.51 mg/dL — ABNORMAL HIGH (ref 0.61–1.24)
GFR calc Af Amer: 55 mL/min — ABNORMAL LOW (ref >60)
GFR, EST NON AFRICAN AMERICAN: 48 mL/min — AB (ref >60)
Glucose, Bld: 119 mg/dL — ABNORMAL HIGH (ref 65–99)
POTASSIUM: 4.4 mmol/L (ref 3.5–5.1)
Sodium: 132 mmol/L — ABNORMAL LOW (ref 135–145)

## 2017-04-03 LAB — BRAIN NATRIURETIC PEPTIDE: B NATRIURETIC PEPTIDE 5: 289 pg/mL — AB (ref 0.0–100.0)

## 2017-04-03 LAB — DIGOXIN LEVEL: Digoxin Level: 0.3 ng/mL — ABNORMAL LOW (ref 0.8–2.0)

## 2017-04-03 MED ORDER — SACUBITRIL-VALSARTAN 49-51 MG PO TABS: 1.0000 | ORAL_TABLET | Freq: Two times a day (BID) | ORAL | 3 refills | Status: DC

## 2017-04-03 NOTE — Progress Notes (Signed)
PCP: Dr Felipa Eth Primary Cardiologist: Dr Martinique HF MD: Dr Haroldine Laws GI: Dr Wynetta Emery  HPI: Joshua Raymond is a 61 year old with a history of HTN, GERD, nonobstructive CAD, and chronic systolic heart failure.   Admitted the April 2018  with increased dypnea. Diuresed with IV lasix and transitioned to po lasix. Had LHC/RHC with stable cors and marginal cardiac output. CMRI with possible prior myocarditis. EF severely reduced. HF meds adjusted. Discharge weight 244 pounds.  Today he returns for HF follow up. Overall feeling fair. Complaining of abdominal bloating. Sleeping in recliner. He notices difficulty at night with SOB and cough. Complaining of nausea about 4 hours a day. He says he has been having ongoing difficulty with ongoing nausea for the last 8 months. He has been followed GI. Says he has not problems eating food. Appetite picking up.; Weight at home trending down 239 > 234 pounds. Taking all medications. Has not had  alcohol in 6 weeks.  Retired Architectural technologist. Lives at home with his wife.   Cardiac Studies  CMRI 03/27/2017 EV  14%. Dilated RV, moderate central Joshua, LGE with possible prior myocarditis.   ECHO 02/2017 ECHO LVEF 10-15% Grade 3 DD, moderate Joshua, mild/mod RAE. Possible Trabeculaition.   LHC/RHC 03/24/2017 Cors ok RA 14 PAP 57/33 (42) PCWP 31 mmhg CO/CI  4.52/1.92    ROS: All systems negative except as listed in HPI, PMH and Problem List.  SH:  Social History   Social History  . Marital status: Married    Spouse name: N/A  . Number of children: N/A  . Years of education: N/A   Occupational History  . Not on file.   Social History Main Topics  . Smoking status: Former Research scientist (life sciences)  . Smokeless tobacco: Never Used     Comment: stopped 34 years ago  . Alcohol use Yes     Comment: moderation 1 drink a day during the week  . Drug use: No  . Sexual activity: Not on file   Other Topics Concern  . Not on file   Social History Narrative  . No narrative on file     FH:  Family History  Problem Relation Age of Onset  . Ovarian cancer Mother   . Heart attack Father     Past Medical History:  Diagnosis Date  . CHF (congestive heart failure) ([place])   . Complication of anesthesia   . GERD (gastroesophageal reflux disease)   . Hypertension   . PONV (postoperative nausea and vomiting)     Current Outpatient Prescriptions  Medication Sig Dispense Refill  . aspirin EC 81 MG tablet Take 81 mg by mouth daily.    . carvedilol (COREG) 3.125 MG tablet Take 1 tablet (3.125 mg total) by mouth 2 (two) times daily with a meal. 60 tablet 6  . cetirizine (ZYRTEC) 10 MG tablet Take 10 mg by mouth daily.     . digoxin (LANOXIN) 0.125 MG tablet Take 1 tablet (0.125 mg total) by mouth daily. 30 tablet 6  . fluticasone (FLONASE) 50 MCG/ACT nasal spray Place 1-2 sprays into both nostrils daily.     . furosemide (LASIX) 40 MG tablet Take 1 tablet (40 mg total) by mouth daily. 30 tablet 6  . Ginger, Zingiber officinalis, (GINGER PO) Take 1 tablet by mouth daily as needed (indigestion).    Marland Kitchen losartan (COZAAR) 25 MG tablet Take 1 tablet (25 mg total) by mouth 2 (two) times daily. 60 tablet 6  . Melatonin 3 MG TABS  Take 6 mg by mouth at bedtime as needed.    . Multiple Vitamin (MULTIVITAMIN WITH MINERALS) TABS tablet Take 2 tablets by mouth daily.    . ondansetron (ZOFRAN) 8 MG tablet Take 8 mg by mouth 2 (two) times daily as needed for nausea or vomiting.    . pantoprazole (PROTONIX) 40 MG tablet Take 1 tablet (40 mg total) by mouth daily. 30 tablet 6  . spironolactone (ALDACTONE) 25 MG tablet Take 1 tablet (25 mg total) by mouth daily. 30 tablet 6  . traZODone (DESYREL) 25 mg TABS tablet Take 25 mg by mouth at bedtime.     No current facility-administered medications for this encounter.     Vitals:   04/03/17 1411  BP: 117/74  Pulse: 84  SpO2: 98%  Weight: 241 lb 8 oz (109.5 kg)   Filed Weights   04/03/17 1411  Weight: 241 lb 8 oz (109.5 kg)   PHYSICAL  EXAM: General:  Well appearing. No resp difficulty. Walked in the clinic.  HEENT: normal Neck: supple. JVP 9-10. Carotids 2+ bilaterally; no bruits. No lymphadenopathy or thryomegaly appreciated. Cor: PMI normal. Regular rate & rhythm. No rubs, gallops or murmurs. Lungs: clear Abdomen: soft, nontender, ++ distended. No hepatosplenomegaly. No bruits or masses. Good bowel sounds. Extremities: no cyanosis, clubbing, rash, edema Neuro: alert & orientedx3, cranial nerves grossly intact. Moves all 4 extremities w/o difficulty. Affect pleasant.   ASSESSMENT & PLAN: 1. Chronic Systolic Heart Failure - NICM. LHC 02/2017 stable cors. ECHO and CMRI with severely reduced EF ~15% and possible myocarditis.   NYHA IIIB. Volume status elevated. Continue lasix 40 mg daily and he will take an extra 40 mg of lasix.   May need to increase lasix to twice a day but for now I want to see how he responds to entresto.  .\ Continue spiro 25 mg daily.  Stop losartan and start entresto 49-51 twice a day.  Continue carvedilol 3.125 mg twice a day. Continue digoxin 0.125 mg daily.  Check BMET, BNP, and dig level.  Plan to check ECHO in 3 months after HF meds optimized. Discussed the possibility of and ICD. Narrow QRS recent admit.  2. Suspected Sleep Apnea - In the past he used CPAP. Dr Felipa Eth set up sleep study.   3. H/O AKI  Check BMET, BNP, dig level  Follow up 2 weeks  Greater than 50% of the 25 minutes visit spent in counseling/coordination of care regarding heart failure and HF meds.   Amy Clegg NP-C

## 2017-04-03 NOTE — Patient Instructions (Signed)
Labs today (will call for abnormal results, otherwise no news is good news)  Take an extra lasix today only.  STOP taking Losartan  START taking Entresto 49-51 mg (1 Tablet) Twice Daily.  Follow up in 2 weeks.

## 2017-04-05 ENCOUNTER — Encounter (HOSPITAL_COMMUNITY): Payer: No Typology Code available for payment source

## 2017-04-17 ENCOUNTER — Encounter (HOSPITAL_COMMUNITY): Payer: Self-pay

## 2017-04-17 ENCOUNTER — Ambulatory Visit (HOSPITAL_COMMUNITY)
Admission: RE | Admit: 2017-04-17 | Discharge: 2017-04-17 | Disposition: A | Discharge status: Home / Self Care | Payer: No Typology Code available for payment source | Source: Ambulatory Visit | Attending: Cardiology | Admitting: Cardiology

## 2017-04-17 VITALS — BP 104/60 | HR 86 | Wt 237.0 lb

## 2017-04-17 DIAGNOSIS — I130 Hypertensive heart and chronic kidney disease with heart failure and stage 1 through stage 4 chronic kidney disease, or unspecified chronic kidney disease: Secondary | ICD-10-CM | POA: Diagnosis present

## 2017-04-17 DIAGNOSIS — I428 Other cardiomyopathies: Secondary | ICD-10-CM

## 2017-04-17 DIAGNOSIS — N183 Chronic kidney disease, stage 3 unspecified: Secondary | ICD-10-CM

## 2017-04-17 DIAGNOSIS — I5022 Chronic systolic (congestive) heart failure: Secondary | ICD-10-CM | POA: Diagnosis not present

## 2017-04-17 DIAGNOSIS — R29818 Other symptoms and signs involving the nervous system: Secondary | ICD-10-CM | POA: Diagnosis not present

## 2017-04-17 DIAGNOSIS — I5042 Chronic combined systolic (congestive) and diastolic (congestive) heart failure: Secondary | ICD-10-CM

## 2017-04-17 DIAGNOSIS — I429 Cardiomyopathy, unspecified: Secondary | ICD-10-CM | POA: Insufficient documentation

## 2017-04-17 DIAGNOSIS — Z87891 Personal history of nicotine dependence: Secondary | ICD-10-CM | POA: Insufficient documentation

## 2017-04-17 DIAGNOSIS — K219 Gastro-esophageal reflux disease without esophagitis: Secondary | ICD-10-CM | POA: Diagnosis not present

## 2017-04-17 DIAGNOSIS — Z7982 Long term (current) use of aspirin: Secondary | ICD-10-CM | POA: Insufficient documentation

## 2017-04-17 LAB — BASIC METABOLIC PANEL
Anion gap: 9 (ref 5–15)
BUN: 24 mg/dL — ABNORMAL HIGH (ref 6–20)
CO2: 27 mmol/L (ref 22–32)
Calcium: 9.5 mg/dL (ref 8.9–10.3)
Chloride: 99 mmol/L — ABNORMAL LOW (ref 101–111)
Creatinine, Ser: 1.44 mg/dL — ABNORMAL HIGH (ref 0.61–1.24)
GFR calc Af Amer: 59 mL/min — ABNORMAL LOW (ref >60)
GFR calc non Af Amer: 51 mL/min — ABNORMAL LOW (ref >60)
Glucose, Bld: 119 mg/dL — ABNORMAL HIGH (ref 65–99)
Potassium: 4.3 mmol/L (ref 3.5–5.1)
Sodium: 135 mmol/L (ref 135–145)

## 2017-04-17 LAB — BRAIN NATRIURETIC PEPTIDE: B Natriuretic Peptide: 445.6 pg/mL — ABNORMAL HIGH (ref 0.0–100.0)

## 2017-04-17 MED ORDER — ONDANSETRON HCL 8 MG PO TABS: 8.0000 mg | ORAL_TABLET | Freq: Two times a day (BID) | ORAL | 1 refills | Status: DC | PRN

## 2017-04-17 NOTE — Patient Instructions (Signed)
Zofran refilled to pharmacy.  Use Copay card for [facility]. Call CHF clinical Pharmacist Ileene Patrick if still no difference in cost.  Routine lab work today. Will notify you of abnormal results, otherwise no news is good news!  Follow up 2 weeks.  Do the following things EVERYDAY: 1) Weigh yourself in the morning before breakfast. Write it down and keep it in a log. 2) Take your medicines as prescribed 3) Eat low salt foods-Limit salt (sodium) to 2000 mg per day.  4) Stay as active as you can everyday 5) Limit all fluids for the day to less than 2 liters

## 2017-04-17 NOTE — Progress Notes (Signed)
Advanced Heart Failure Clinic Note   Referring Physician: Dr. Tamala Julian Primary Care: Dr Felipa Eth Primary Cardiologist: Dr. Martinique   HPI:  Joshua Raymond is a 61 y.o. male with PMH of HTN, GERD, NICM (normal cors by cath in 06/9380), chronic systolic CHF (EF 01-75% in April 2017). Previous EF was 40-45% by cath in 2011.   He was admitted 03/22/17-03/27/17 with acute on chronic systolic CHF. Troponin elevated at 1.71, cath showed no obstructive CAD.  Right and left heart cath on 03/24/17 with normal cors, fick cardiac output/index 4.52/1.92. LVEDP 31. Full results below. 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 There was some question of LV thrombus but after further review and review of his Echo with contrast it was felt that there was no LV thrombus, rather trabeculation adjacent to an area of LGE. Therefore, there is no need for anticoagulation.  He was diuresed with IV lasix and discharge weight was 244 pounds.   He returns today for HF follow up. Overall feeling well. He is keeping track of his sodium intake as well as his fluid intake. He is averaging less than 1 gram of sodium a day. Drinking less than 2L a day. Denies SOB with walking into clinic. He is working out at [employer], walking on the treadmill without SOB. Weights at home 232-234 pounds. He feels nauseous about an hour after he takes his morning medications. Not drinking any alcohol. Does not smoke cigarettes.       Review of Systems: [y] = yes, [ ]  = no   General: Weight gain [ ] ; Weight loss [ ] ; Anorexia [ ] ; Fatigue Blue.Reese ]; Fever [ ] ; Chills [ ] ; Weakness [ ]   Cardiac: Chest pain/pressure [ ] ; Resting SOB [ ] ; Exertional SOB [ ] ; Orthopnea [ ] ; Pedal Edema [ ] ; Palpitations [ ] ; Syncope [ ] ; Presyncope [ ] ; Paroxysmal nocturnal dyspnea[ ]   Pulmonary: Cough [ ] ; Wheezing[ ] ; Hemoptysis[ ] ; Sputum [ ] ; Snoring [ ]   GI:  Vomiting[ ] ; Dysphagia[ ] ; Melena[ ] ; Hematochezia [ ] ; Heartburn[y ]; Abdominal pain [ ] ; Constipation [ ] ; Diarrhea [ ] ; BRBPR [ ]   GU: Hematuria[ ] ; Dysuria [ ] ; Nocturia[ ]   Vascular: Pain in legs with walking [ ] ; Pain in feet with lying flat [ ] ; Non-healing sores [ ] ; Stroke [ ] ; TIA [ ] ; Slurred speech [ ] ;  Neuro: Headaches[ ] ; Vertigo[ ] ; Seizures[ ] ; Paresthesias[ ] ;Blurred vision [ ] ; Diplopia [ ] ; Vision changes [ ]   Ortho/Skin: Arthritis [ y]; Joint pain [ ] ; Muscle pain [ ] ; Joint swelling [ ] ; Back Pain [ ] ; Rash [ ]   Psych: Depression[ ] ; Anxiety[ ]   Heme: Bleeding problems [ ] ; Clotting disorders [ ] ; Anemia [ ]   Endocrine: Diabetes [ ] ; Thyroid dysfunction[ ]    Past Medical History:  Diagnosis Date  . CHF (congestive heart failure) ([place])   . Complication of anesthesia   . GERD (gastroesophageal reflux disease)   . Hypertension   . PONV (postoperative nausea and vomiting)     Current Outpatient Prescriptions  Medication Sig Dispense Refill  . aspirin EC 81 MG tablet Take 81 mg by mouth daily.    . carvedilol (COREG) 3.125 MG tablet Take 1 tablet (3.125 mg total) by mouth 2 (two) times daily with a meal. 60 tablet 6  . cetirizine (ZYRTEC) 10 MG tablet Take 10 mg by mouth  daily.     . digoxin (LANOXIN) 0.125 MG tablet Take 1 tablet (0.125 mg total) by mouth daily. 30 tablet 6  . fluticasone (FLONASE) 50 MCG/ACT nasal spray Place 1-2 sprays into both nostrils daily.     . furosemide (LASIX) 40 MG tablet Take 1 tablet (40 mg total) by mouth daily. 30 tablet 6  . Ginger, Zingiber officinalis, (GINGER PO) Take 1 tablet by mouth daily as needed (indigestion).    . Melatonin 3 MG TABS Take 6 mg by mouth at bedtime as needed.    . Multiple Vitamin (MULTIVITAMIN WITH MINERALS) TABS tablet Take 2 tablets by mouth daily.    . ondansetron (ZOFRAN) 8 MG tablet Take 8 mg by mouth 2 (two) times daily as needed for nausea or vomiting.    . pantoprazole (PROTONIX) 40 MG tablet Take 1  tablet (40 mg total) by mouth daily. 30 tablet 6  . sacubitril-valsartan (ENTRESTO) 49-51 MG Take 1 tablet by mouth 2 (two) times daily. 60 tablet 3  . spironolactone (ALDACTONE) 25 MG tablet Take 1 tablet (25 mg total) by mouth daily. 30 tablet 6  . traZODone (DESYREL) 25 mg TABS tablet Take 25 mg by mouth at bedtime.     No current facility-administered medications for this encounter.     Allergies  Allergen Reactions  . Other Nausea Only and Other (See Comments)    General anesthesia      Social History   Social History  . Marital status: Married    Spouse name: N/A  . Number of children: N/A  . Years of education: N/A   Occupational History  . Not on file.   Social History Main Topics  . Smoking status: Former Research scientist (life sciences)  . Smokeless tobacco: Never Used     Comment: stopped 34 years ago  . Alcohol use Yes     Comment: moderation 1 drink a day during the week  . Drug use: No  . Sexual activity: Not on file   Other Topics Concern  . Not on file   Social History Narrative  . No narrative on file      Family History  Problem Relation Age of Onset  . Ovarian cancer Mother   . Heart attack Father     Vitals:   04/17/17 1405  BP: 104/60  Pulse: 86  SpO2: 98%  Weight: 237 lb (107.5 kg)     PHYSICAL EXAM: General:  Well appearing. No respiratory difficulty HEENT: normal, atramatic Neck: supple. no JVD. Carotids 2+ bilat; no bruits. No lymphadenopathy or thyromegaly appreciated. Cor: PMI nondisplaced. Regular rate & rhythm. No rubs, gallops or murmurs. Lungs: clear in all lobes. No wheeze. Has dry cough.  Abdomen: soft, nontender, nondistended. No hepatosplenomegaly. No bruits or masses. Good bowel sounds. Extremities: no cyanosis, clubbing, rash, edema Neuro: alert & oriented x 3, cranial nerves grossly intact. moves all 4 extremities w/o difficulty. Affect pleasant.    ASSESSMENT & PLAN: 1. Chronic systolic CHF: EF 33-29%. 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 SPEP  negative. Normal cors in 02/2017.  - NYHA II - Continue Coreg 3.125 mg BID. Will not increase as BP is soft.  - Continue digoxin 0.136mcg. Dig level 0.3 earlier this month.  - Volume status stable - Continue Lasix 40mg  daily.  - Continue Entresto 49/51mg  BID, will not increase today with soft BP.  - Continue Spiro 25mg  daily.  - BMET today - Follow up in 2 weeks.  - Echo in 3 months, may need ICD.  2. Suspected OSA:  - For sleep study this week.  3. Chronic kidney disease stage III - BMET today.   BMET, BNP today.    Arbutus Leas, NP 04/17/17

## 2017-04-19 ENCOUNTER — Encounter (HOSPITAL_COMMUNITY): Payer: Self-pay

## 2017-05-01 ENCOUNTER — Ambulatory Visit (HOSPITAL_COMMUNITY)
Admission: RE | Admit: 2017-05-01 | Discharge: 2017-05-01 | Disposition: A | Discharge status: Home / Self Care | Payer: No Typology Code available for payment source | Source: Ambulatory Visit | Attending: Cardiology | Admitting: Cardiology

## 2017-05-01 ENCOUNTER — Encounter (HOSPITAL_COMMUNITY): Payer: Self-pay

## 2017-05-01 VITALS — BP 122/86 | HR 96 | Wt 238.0 lb

## 2017-05-01 DIAGNOSIS — Z87891 Personal history of nicotine dependence: Secondary | ICD-10-CM | POA: Diagnosis not present

## 2017-05-01 DIAGNOSIS — N183 Chronic kidney disease, stage 3 unspecified: Secondary | ICD-10-CM

## 2017-05-01 DIAGNOSIS — I429 Cardiomyopathy, unspecified: Secondary | ICD-10-CM | POA: Diagnosis not present

## 2017-05-01 DIAGNOSIS — I5042 Chronic combined systolic (congestive) and diastolic (congestive) heart failure: Secondary | ICD-10-CM | POA: Diagnosis not present

## 2017-05-01 DIAGNOSIS — K219 Gastro-esophageal reflux disease without esophagitis: Secondary | ICD-10-CM | POA: Diagnosis not present

## 2017-05-01 DIAGNOSIS — I130 Hypertensive heart and chronic kidney disease with heart failure and stage 1 through stage 4 chronic kidney disease, or unspecified chronic kidney disease: Secondary | ICD-10-CM | POA: Insufficient documentation

## 2017-05-01 DIAGNOSIS — G4733 Obstructive sleep apnea (adult) (pediatric): Secondary | ICD-10-CM | POA: Diagnosis not present

## 2017-05-01 DIAGNOSIS — I5022 Chronic systolic (congestive) heart failure: Secondary | ICD-10-CM | POA: Diagnosis not present

## 2017-05-01 DIAGNOSIS — Z7982 Long term (current) use of aspirin: Secondary | ICD-10-CM | POA: Diagnosis not present

## 2017-05-01 DIAGNOSIS — I428 Other cardiomyopathies: Secondary | ICD-10-CM

## 2017-05-01 MED ORDER — CARVEDILOL 6.25 MG PO TABS: 6.2500 mg | ORAL_TABLET | Freq: Two times a day (BID) | ORAL | 3 refills | Status: DC

## 2017-05-01 NOTE — Patient Instructions (Signed)
INCREASE Coreg to 6.25 mg (1 Tablet) Two Times daily with meal.   Follow up with an Echo in 2 Months.

## 2017-05-01 NOTE — Progress Notes (Signed)
Advanced Heart Failure Clinic Note   Referring Physician: Dr. Tamala Julian Primary Care: Dr Felipa Eth Primary Cardiologist: Dr. Martinique   HPI:  Joshua Raymond is a 61 y.o. male with PMH of HTN, GERD, NICM (normal cors by cath in 11/270), chronic systolic CHF (EF 53-66% in April 2017). Previous EF was 40-45% by cath in 2011.   He was admitted 03/22/17-03/27/17 with acute on chronic systolic CHF. Troponin elevated at 1.71, cath showed no obstructive CAD.  Right and left heart cath on 03/24/17 with normal cors, fick cardiac output/index 4.52/1.92. LVEDP 31. Full results below. 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 There was some question of LV thrombus but after further review and review of his Echo with contrast it was felt that there was no LV thrombus, rather trabeculation adjacent to an area of LGE. Therefore, there is no need for anticoagulation.  He was diuresed with IV lasix and discharge weight was 244 pounds.   He presents today for HF follow up. He continues to do well. Is diligent about limiting his sodium and fluid intake (keeps a daily log). Weights stable at home - 237-238 pounds. He walks for exercise and does not get SOB. Taking all medications with compliance. He denies SOB, chest pain and palpitations. Does have a cough at night when he lies down, sometimes productive of clear sputum. He denies fever and chills. He did have a sleep study in May which showed sleep apnea. He will have CPAP titration this week.     Past Medical History:  Diagnosis Date  . CHF (congestive heart failure) ([place])   . Complication of anesthesia   . GERD (gastroesophageal reflux disease)   . Hypertension   . PONV (postoperative nausea and vomiting)     Current Outpatient Prescriptions  Medication Sig Dispense Refill  . aspirin EC 81 MG tablet Take 81 mg by mouth daily.    . carvedilol (COREG) 3.125  MG tablet Take 1 tablet (3.125 mg total) by mouth 2 (two) times daily with a meal. 60 tablet 6  . cetirizine (ZYRTEC) 10 MG tablet Take 10 mg by mouth daily.     . digoxin (LANOXIN) 0.125 MG tablet Take 1 tablet (0.125 mg total) by mouth daily. 30 tablet 6  . fluticasone (FLONASE) 50 MCG/ACT nasal spray Place 1-2 sprays into both nostrils daily.     . furosemide (LASIX) 40 MG tablet Take 1 tablet (40 mg total) by mouth daily. 30 tablet 6  . Ginger, Zingiber officinalis, (GINGER PO) Take 1 tablet by mouth daily as needed (indigestion).    . Melatonin 3 MG TABS Take 6 mg by mouth at bedtime as needed.    . Multiple Vitamin (MULTIVITAMIN WITH MINERALS) TABS tablet Take 2 tablets by mouth daily.    . ondansetron (ZOFRAN) 8 MG tablet Take 1 tablet (8 mg total) by mouth 2 (two) times daily as needed for nausea or vomiting. 60 tablet 1  . pantoprazole (PROTONIX) 40 MG tablet Take 1 tablet (40 mg total) by mouth daily. 30 tablet 6  . sacubitril-valsartan (ENTRESTO) 49-51 MG Take 1 tablet by mouth 2 (two) times daily. 60 tablet 3  . spironolactone (ALDACTONE) 25 MG tablet Take 1 tablet (25 mg total) by mouth daily. 30 tablet 6  . traZODone (DESYREL) 25 mg TABS tablet Take 25 mg by mouth at bedtime.     No current facility-administered medications  for this encounter.     Allergies  Allergen Reactions  . Other Nausea Only and Other (See Comments)    General anesthesia      Social History   Social History  . Marital status: Married    Spouse name: N/A  . Number of children: N/A  . Years of education: N/A   Occupational History  . Not on file.   Social History Main Topics  . Smoking status: Former Research scientist (life sciences)  . Smokeless tobacco: Never Used     Comment: stopped 34 years ago  . Alcohol use Yes     Comment: moderation 1 drink a day during the week  . Drug use: No  . Sexual activity: Not on file   Other Topics Concern  . Not on file   Social History Narrative  . No narrative on file       Family History  Problem Relation Age of Onset  . Ovarian cancer Mother   . Heart attack Father     Vitals:   05/01/17 1508  BP: 122/86  Pulse: 96  SpO2: 97%  Weight: 238 lb (108 kg)     PHYSICAL EXAM: General:  Well appearing male. Walked into clinic without SOB.  HEENT: Normal.  Neck: supple. No JVD. Carotids 2+ bilat; no bruits. No lymphadenopathy or thyromegaly appreciated. Cor: PMI nondisplaced. Regular rate and rhythm. . No rubs, gallops or murmurs. Lungs: Clear bilaterally. Normal effort.  Abdomen: soft, nontender, nondistended. No hepatosplenomegaly. No bruits or masses. Good bowel sounds. Extremities: no cyanosis, clubbing, rash. Warm. No edema.  Neuro: alert & oriented x 3, cranial nerves grossly intact. moves all 4 extremities w/o difficulty. Affect pleasant.    ASSESSMENT & PLAN: 1. Chronic systolic CHF: EF 94-76%. 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 SPEP negative. Normal cors in 02/2017.  - NYHA II - Volume status stable.  - Increase Coreg to 6.25mg  BID. Told him he can start taking 6.25mg  at night/3.25 in the am for a few days, then increase to 6.25mg  BID. HR is 96.   - Continue digoxin 0.125mcg. Dig level 0.3 in May 2018.  - Continue Lasix 40mg  daily.  - Continue Entresto 49/51mg  BID. - Continue Spiro 25mg  daily.  - Follow up in 8 weeks with an Echo, and see Dr. Haroldine Laws that visit. He will need an ICD if EF does not improve.  2. OSA - Sleep study with + sleep apnea.  - Will get CPAP this week and go back for titration test. 3. Chronic kidney disease stage III - Stable.   Follow up in 8 weeks with DB with an Echo.    Arbutus Leas, NP 05/01/17

## 2017-07-03 ENCOUNTER — Ambulatory Visit (HOSPITAL_COMMUNITY)
Admission: RE | Admit: 2017-07-03 | Discharge: 2017-07-03 | Disposition: A | Discharge status: Home / Self Care | Payer: No Typology Code available for payment source | Source: Ambulatory Visit | Attending: Geriatric Medicine | Admitting: Geriatric Medicine

## 2017-07-03 ENCOUNTER — Ambulatory Visit (HOSPITAL_BASED_OUTPATIENT_CLINIC_OR_DEPARTMENT_OTHER)
Admission: RE | Admit: 2017-07-03 | Discharge: 2017-07-03 | Disposition: A | Discharge status: Home / Self Care | Payer: No Typology Code available for payment source | Source: Ambulatory Visit | Attending: Internal Medicine | Admitting: Internal Medicine

## 2017-07-03 ENCOUNTER — Encounter (HOSPITAL_COMMUNITY): Payer: Self-pay | Admitting: Internal Medicine

## 2017-07-03 VITALS — BP 132/84 | HR 80 | Wt 243.2 lb

## 2017-07-03 DIAGNOSIS — I5042 Chronic combined systolic (congestive) and diastolic (congestive) heart failure: Secondary | ICD-10-CM | POA: Diagnosis not present

## 2017-07-03 DIAGNOSIS — I110 Hypertensive heart disease with heart failure: Secondary | ICD-10-CM | POA: Insufficient documentation

## 2017-07-03 DIAGNOSIS — I340 Nonrheumatic mitral (valve) insufficiency: Secondary | ICD-10-CM | POA: Insufficient documentation

## 2017-07-03 LAB — BASIC METABOLIC PANEL
ANION GAP: 8 (ref 5–15)
BUN: 23 mg/dL — ABNORMAL HIGH (ref 6–20)
CALCIUM: 9.4 mg/dL (ref 8.9–10.3)
CO2: 26 mmol/L (ref 22–32)
Chloride: 102 mmol/L (ref 101–111)
Creatinine, Ser: 1.37 mg/dL — ABNORMAL HIGH (ref 0.61–1.24)
GFR, EST NON AFRICAN AMERICAN: 54 mL/min — AB (ref >60)
GLUCOSE: 101 mg/dL — AB (ref 65–99)
POTASSIUM: 3.8 mmol/L (ref 3.5–5.1)
Sodium: 136 mmol/L (ref 135–145)

## 2017-07-03 MED ORDER — FUROSEMIDE 40 MG PO TABS: ORAL_TABLET | ORAL | 6 refills | Status: DC

## 2017-07-03 MED ORDER — CARVEDILOL 6.25 MG PO TABS: 9.3750 mg | ORAL_TABLET | Freq: Two times a day (BID) | ORAL | 3 refills | Status: DC

## 2017-07-03 NOTE — Patient Instructions (Signed)
Labs today (will call for abnormal results, otherwise no news is good news)  STOP taking digoxin  INCREASE Coreg to 9.375 mg (1.5 Tablets) Twice Daily  DECREASE Lasix to 40 mg only on Monday, Wednesday, and Fridays  Follow up with Doroteo Bradford PharmD in 1 Month  Follow up with Dr. Haroldine Laws in 3 Months

## 2017-07-03 NOTE — Progress Notes (Signed)
  Echocardiogram 2D Echocardiogram has been performed.  [place] 07/03/2017, 1:43 PM

## 2017-07-03 NOTE — Progress Notes (Signed)
Advanced Heart Failure Clinic Note   Referring Physician: Dr. Tamala Julian Primary Care: Dr Felipa Eth Primary Cardiologist: Dr. Martinique   HPI:  Joshua Raymond is a 61 y.o. male with PMH of HTN, GERD, NICM (normal cors by cath in 01/4192), chronic systolic CHF (EF 79-02% in April 2017). Previous EF was 40-45% by cath in 2011.   He was admitted 03/22/17-03/27/17 with acute on chronic systolic CHF. Troponin elevated at 1.71, cath showed no obstructive CAD.  Right and left heart cath on 03/24/17 with normal cors, fick cardiac output/index 4.52/1.92. LVEDP 31. Full results below. 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 There was some question of LV thrombus but after further review and review of his Echo with contrast it was felt that there was no LV thrombus, rather trabeculation adjacent to an area of LGE. Therefore, there is no need for anticoagulation.  He was diuresed with IV lasix and discharge weight was 244 pounds.   Mr. Broughton presents today for follow up. Overall feeling well, continues to be compliant with CPAP. Echo today shows improved EF of 35-40%. His weight at home is stable at 235-237 pounds. He feels great, best he has felt in a year. He is not SOB with kayaking, working in the yard with a chainsaw. Has gone kayaking. No SOB with walking up stairs. He denies syncope, presyncope. Taking all medications.     Past Medical History:  Diagnosis Date  . CHF (congestive heart failure) ([place])   . Complication of anesthesia   . GERD (gastroesophageal reflux disease)   . Hypertension   . PONV (postoperative nausea and vomiting)     Current Outpatient Prescriptions  Medication Sig Dispense Refill  . aspirin EC 81 MG tablet Take 81 mg by mouth daily.    . carvedilol (COREG) 6.25 MG tablet Take 1 tablet (6.25 mg total) by mouth 2 (two) times daily with a meal. 60 tablet 3  . cetirizine  (ZYRTEC) 10 MG tablet Take 10 mg by mouth daily.     . digoxin (LANOXIN) 0.125 MG tablet Take 1 tablet (0.125 mg total) by mouth daily. 30 tablet 6  . fluticasone (FLONASE) 50 MCG/ACT nasal spray Place 1-2 sprays into both nostrils daily.     . furosemide (LASIX) 40 MG tablet Take 1 tablet (40 mg total) by mouth daily. 30 tablet 6  . Ginger, Zingiber officinalis, (GINGER PO) Take 1 tablet by mouth daily as needed (indigestion).    . Melatonin 3 MG TABS Take 6 mg by mouth at bedtime as needed.    . Multiple Vitamin (MULTIVITAMIN WITH MINERALS) TABS tablet Take 2 tablets by mouth daily.    . pantoprazole (PROTONIX) 40 MG tablet Take 1 tablet (40 mg total) by mouth daily. 30 tablet 6  . sacubitril-valsartan (ENTRESTO) 49-51 MG Take 1 tablet by mouth 2 (two) times daily. 60 tablet 3  . spironolactone (ALDACTONE) 25 MG tablet Take 1 tablet (25 mg total) by mouth daily. 30 tablet 6   No current facility-administered medications for this encounter.     Allergies  Allergen Reactions  . Other Nausea Only and Other (See Comments)    General anesthesia      Social History   Social History  . Marital status: Married    Spouse name: N/A  . Number of children: N/A  . Years of education: N/A   Occupational History  . Not  on file.   Social History Main Topics  . Smoking status: Former Research scientist (life sciences)  . Smokeless tobacco: Never Used     Comment: stopped 34 years ago  . Alcohol use Yes     Comment: moderation 1 drink a day during the week  . Drug use: No  . Sexual activity: Not on file   Other Topics Concern  . Not on file   Social History Narrative  . No narrative on file      Family History  Problem Relation Age of Onset  . Ovarian cancer Mother   . Heart attack Father     Vitals:   07/03/17 1405  BP: 132/84  Pulse: 80  SpO2: 96%  Weight: 243 lb 3.2 oz (110.3 kg)   Filed Weights   07/03/17 1405  Weight: 243 lb 3.2 oz (110.3 kg)     PHYSICAL EXAM: General:  Well appearing  male, NAD.  HEENT: Normal.  Neck: Supple, JVP flat.  Carotids 2+ bilat; no bruits. No lymphadenopathy or thyromegaly appreciated. Cor: PMI nondisplaced. Regular rate and rhythm. No M/R/G.  Lungs: Clear bilaterally. Normal effort.  Abdomen: Soft, non tender, non distended. No hepatosplenomegaly. No bruits or masses. Good bowel sounds. Extremities: no cyanosis, clubbing, rash. No peripheral edema, warm.  Neuro: Alert and oriented x 3.  cranial nerves grossly intact. moves all 4 extremities w/o difficulty. Affect pleasant.    ASSESSMENT & PLAN: 1. Chronic systolic CHF: EF 16-10%. 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 SPEP negative. Normal cors in 02/2017. Echo today, now with improved EF of 35-40%.  - NYHA II - Volume status stable on exam.  - Decrease Lasix to 40 mg every M/W/F.  - Increase Coreg to 9.375 mg BID.  - Continue Entresto 49/51 mg BID - Stop digoxin.  - Continue Spiro 25mg  daily.  - Will hold off on referral to EP for now.    2. OSA - Continues to wear CPAP, notes increased energy and sleep quality.    3. Chronic kidney disease stage III - Baseline creatinine 1.3-1.5.  - BMET today.   Follow up in 1 month with PharmD to see if there is room to increase Entresto. Follow up in 3 months with Dr. Haroldine Laws.    Arbutus Leas, NP 07/03/17   Patient seen and examined with Jettie Booze, NP. We discussed all aspects of the encounter. I agree with the assessment and plan as stated above.   Echo reviewed personally EF 35-40% (improved) Functionally NYHA I-II> agree with med changes as above. Recheck labs.   Glori Bickers, MD  10:21 PM

## 2017-07-30 ENCOUNTER — Other Ambulatory Visit (HOSPITAL_COMMUNITY): Payer: Self-pay | Admitting: Adult Health

## 2017-08-09 ENCOUNTER — Ambulatory Visit (HOSPITAL_COMMUNITY)
Admission: RE | Admit: 2017-08-09 | Discharge: 2017-08-09 | Disposition: A | Discharge status: Home / Self Care | Payer: No Typology Code available for payment source | Source: Ambulatory Visit | Attending: Internal Medicine | Admitting: Internal Medicine

## 2017-08-09 VITALS — BP 124/78 | HR 78 | Wt 249.0 lb

## 2017-08-09 DIAGNOSIS — G4733 Obstructive sleep apnea (adult) (pediatric): Secondary | ICD-10-CM | POA: Diagnosis not present

## 2017-08-09 DIAGNOSIS — I5022 Chronic systolic (congestive) heart failure: Secondary | ICD-10-CM | POA: Diagnosis present

## 2017-08-09 DIAGNOSIS — I428 Other cardiomyopathies: Secondary | ICD-10-CM | POA: Diagnosis not present

## 2017-08-09 DIAGNOSIS — K219 Gastro-esophageal reflux disease without esophagitis: Secondary | ICD-10-CM | POA: Insufficient documentation

## 2017-08-09 DIAGNOSIS — N183 Chronic kidney disease, stage 3 (moderate): Secondary | ICD-10-CM | POA: Insufficient documentation

## 2017-08-09 DIAGNOSIS — I130 Hypertensive heart and chronic kidney disease with heart failure and stage 1 through stage 4 chronic kidney disease, or unspecified chronic kidney disease: Secondary | ICD-10-CM | POA: Diagnosis not present

## 2017-08-09 DIAGNOSIS — I5043 Acute on chronic combined systolic (congestive) and diastolic (congestive) heart failure: Secondary | ICD-10-CM

## 2017-08-09 LAB — BASIC METABOLIC PANEL
ANION GAP: 9 (ref 5–15)
BUN: 23 mg/dL — ABNORMAL HIGH (ref 6–20)
CHLORIDE: 103 mmol/L (ref 101–111)
CO2: 21 mmol/L — AB (ref 22–32)
CREATININE: 1.21 mg/dL (ref 0.61–1.24)
Calcium: 9.2 mg/dL (ref 8.9–10.3)
GFR calc non Af Amer: >60 mL/min (ref >60)
GLUCOSE: 95 mg/dL (ref 65–99)
Potassium: 4.4 mmol/L (ref 3.5–5.1)
SODIUM: 133 mmol/L — AB (ref 135–145)

## 2017-08-09 LAB — BRAIN NATRIURETIC PEPTIDE: B Natriuretic Peptide: 56.9 pg/mL (ref 0.0–100.0)

## 2017-08-09 MED ORDER — CARVEDILOL 12.5 MG PO TABS: 12.5000 mg | ORAL_TABLET | Freq: Two times a day (BID) | ORAL | 5 refills | Status: DC

## 2017-08-09 NOTE — Patient Instructions (Addendum)
It was great to see you today!  Please INCREASE your carvedilol to 12.5 mg TWICE DAILY.  Lab work today. We will call you with any abnormalities.   You have an appointment with Doroteo Bradford, PharmD again on 09/06/17.   Please keep your appointment with Dr. Haroldine Laws on 10/03/17.

## 2017-08-09 NOTE — Progress Notes (Signed)
HF MD: Haroldine Laws  HPI:  Joshua Raymond a 61 y.o.Caucasian malewith PMH of HTN, GERD, NICM (normal cors by cath in 03/92), chronic systolic CHF (EF 81-82% in April 2017). Previous EF was 40-45% by cath in 2011.   He was admitted 03/22/17-03/27/17 with acute on chronic systolic CHF. Troponin elevated at 1.71, cath showed no obstructive CAD.  Right and left heart cath on 03/24/17 with normal cors, fick cardiac output/index 4.52/1.92. LVEDP 31. Full results below. 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 There was some question of LV thrombus but after further review and review of his Echo with contrast it was felt that there was no LV thrombus, rather trabeculation adjacent to an area of LGE. Therefore, there is no need for anticoagulation.  He was diuresed with IV lasix and discharge weight was 244 pounds.   Mr. Nuttall presents today for pharmacist-led HF medication titration. At his last HF clinic visit on 07/03/17, his digoxin was discontinued, carvedilol was increased to 9.375 mg BID and lasix was decreased to 40 mg on M/W/F. Feeling better than he's felt in a year. He is concerned that he has gained ~10 lb since 05/06/17 but does admit to having an increased appetite. Continues to be compliant with CPAP. He is not SOB with yard work (i.e. Pressure washing). He denies syncope, presyncope. Taking all medications as prescribed.    . Shortness of breath/dyspnea on exertion? no  . Orthopnea/PND? no . Edema? no . Lightheadedness/dizziness? No - only if bends over and gets up too quickly . Daily weights at home? Yes - steadily increased ~10 lb since June, now weighing 243 lb . Blood pressure/heart rate monitoring at home? no . Following low-sodium/fluid-restricted diet? No - eats about 1000-1200 mg of Na up from 500 mg a day; <2L/day - 90 oz range in summer  HF Medications: Carvedilol 9.375 mg  PO BID Entresto 49-51 mg PO BID Furosemide 40 mg PO M/W/F Spironolactone 25 mg PO daily   Has the patient been experiencing any side effects to the medications prescribed?  no  Does the patient have any problems obtaining medications due to transportation or finances?   no  Understanding of regimen: good Understanding of indications: good Potential of compliance: good Patient understands to avoid NSAIDs. Patient understands to avoid decongestants.    Pertinent Lab Values: . 08/09/17: Serum creatinine 1.21 (BL ~1.1-1.2), BUN 23, Potassium 4.4, Sodium 133, BNP 56.9  Vital Signs: . Weight: 249 lb (last visit weight: 243 lb) . Blood pressure: 124/78 mmHg  . Heart rate: 78 bpm        ReDS Vest - 08/09/17 1600      ReDS Vest   MR  No   Estimated volume prior to reading Low   Fitting Posture Standing   Height Marker Tall   Ruler Value 8   Center Strip Shifted   ReDS Value 34       Assessment: 1. Chronicsystolic CHF (EF 99-37%), due to NICM vs OSA. NYHA class IIsymptoms.  - Volume status stable based on exam and vest reading of 34%  - Increase carvedilol to 12.5 mg BID (difficulty cutting pills in half)  - Continue Entresto 49-51 mg BID, furosemide 40 mg on M/W/F, and spironolactone 25 mg daily  - At next visit, consider increasing Entresto to target dose  - Basic disease state pathophysiology, medication indication, mechanism and side effects reviewed  at length with patient and he verbalized understanding 2. OSA  - Continues to wear CPAP regularly 3. CKDIII  - BL ~1.2-1.4  - BMET stable  Plan: 1) Medication changes: Based on clinical presentation, vital signs and recent labs will increase carvedilol to 12.5 mg BID 2) Labs: BMET/BNP today 3) Follow-up: Pharmacy visit on 09/06/17 and Dr. Haroldine Laws on 10/03/17   Ruta Hinds. Velva Harman, PharmD, BCPS, CPP Clinical Pharmacist Pager: [phone] Phone: [phone] 08/09/2017 2:42 PM

## 2017-08-31 ENCOUNTER — Ambulatory Visit (HOSPITAL_COMMUNITY)
Admission: RE | Admit: 2017-08-31 | Discharge: 2017-08-31 | Disposition: A | Discharge status: Home / Self Care | Payer: No Typology Code available for payment source | Source: Ambulatory Visit | Attending: Internal Medicine | Admitting: Internal Medicine

## 2017-08-31 VITALS — BP 108/66 | HR 69 | Wt 250.2 lb

## 2017-08-31 DIAGNOSIS — Z7982 Long term (current) use of aspirin: Secondary | ICD-10-CM | POA: Insufficient documentation

## 2017-08-31 DIAGNOSIS — Z8041 Family history of malignant neoplasm of ovary: Secondary | ICD-10-CM | POA: Diagnosis not present

## 2017-08-31 DIAGNOSIS — I429 Cardiomyopathy, unspecified: Secondary | ICD-10-CM | POA: Insufficient documentation

## 2017-08-31 DIAGNOSIS — G4733 Obstructive sleep apnea (adult) (pediatric): Secondary | ICD-10-CM | POA: Diagnosis not present

## 2017-08-31 DIAGNOSIS — K219 Gastro-esophageal reflux disease without esophagitis: Secondary | ICD-10-CM | POA: Diagnosis not present

## 2017-08-31 DIAGNOSIS — Z79899 Other long term (current) drug therapy: Secondary | ICD-10-CM | POA: Diagnosis not present

## 2017-08-31 DIAGNOSIS — Z888 Allergy status to other drugs, medicaments and biological substances status: Secondary | ICD-10-CM | POA: Diagnosis not present

## 2017-08-31 DIAGNOSIS — Z8249 Family history of ischemic heart disease and other diseases of the circulatory system: Secondary | ICD-10-CM | POA: Diagnosis not present

## 2017-08-31 DIAGNOSIS — I130 Hypertensive heart and chronic kidney disease with heart failure and stage 1 through stage 4 chronic kidney disease, or unspecified chronic kidney disease: Secondary | ICD-10-CM | POA: Insufficient documentation

## 2017-08-31 DIAGNOSIS — N183 Chronic kidney disease, stage 3 (moderate): Secondary | ICD-10-CM | POA: Insufficient documentation

## 2017-08-31 DIAGNOSIS — Z87891 Personal history of nicotine dependence: Secondary | ICD-10-CM | POA: Insufficient documentation

## 2017-08-31 DIAGNOSIS — I5022 Chronic systolic (congestive) heart failure: Secondary | ICD-10-CM | POA: Diagnosis not present

## 2017-08-31 MED ORDER — SACUBITRIL-VALSARTAN 97-103 MG PO TABS: 1.0000 | ORAL_TABLET | Freq: Two times a day (BID) | ORAL | 6 refills | Status: DC

## 2017-08-31 NOTE — Patient Instructions (Signed)
Increase Entresto to 97/103 mg Twice daily   We will contact you in 3 months to schedule your next appointment.

## 2017-08-31 NOTE — Progress Notes (Signed)
    ReDS Vest - 08/31/17 1200      ReDS Vest   MR  No   Estimated volume prior to reading Med   Fitting Posture Standing   Height Marker Tall   Ruler Value 8   Center Strip Shifted   ReDS Value 33

## 2017-08-31 NOTE — Progress Notes (Signed)
Advanced Heart Failure Clinic Note   Referring Physician: Dr. Tamala Julian Primary Care: Dr Felipa Eth Primary Cardiologist: Dr. Martinique   HPI:  Joshua Raymond is a 61 y.o. male with PMH of HTN, GERD, NICM (normal cors by cath in 0/6269), chronic systolic CHF (EF 48-54% in April 2017). Previous EF was 40-45% by cath in 2011.   He was admitted 03/22/17-03/27/17 with acute on chronic systolic CHF. Troponin elevated at 1.71, cath showed no obstructive CAD.  Right and left heart cath on 03/24/17 with normal cors, fick cardiac output/index 4.52/1.92. LVEDP 31. Full results below. 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 There was some question of LV thrombus but after further review and review of his Echo with contrast it was felt that there was no LV thrombus, rather trabeculation adjacent to an area of LGE. Therefore, there is no need for anticoagulation.  He was diuresed with IV lasix and discharge weight was 244 pounds.   Echo 8/18 EF 40% (formal read 45-50%)  Joshua Raymond presents today for follow up. Overall feeling well continues to work out in the yard without any difficulty. Able to do most activity without problem. Did 15K steps yesterday. SOB with steps. Feels a bit bloated. Has gained 20 pounds in past few months. Reports some gurgling at night since backing off on the lasix 2 months ago. At last visit carvedilol increased. No dizziness. Last Vest reading 34% (249 pounds at that visit)   Past Medical History:  Diagnosis Date  . CHF (congestive heart failure) ([place])   . Complication of anesthesia   . GERD (gastroesophageal reflux disease)   . Hypertension   . PONV (postoperative nausea and vomiting)     Current Outpatient Prescriptions  Medication Sig Dispense Refill  . aspirin EC 81 MG tablet Take 81 mg by mouth daily.    . carvedilol (COREG) 12.5 MG tablet Take 1 tablet (12.5 mg total)  by mouth 2 (two) times daily. 60 tablet 5  . cetirizine (ZYRTEC) 10 MG tablet Take 10 mg by mouth daily.     . Cyanocobalamin (B-12) 1000 MCG TABS Take 1,000 mcg by mouth 2 (two) times daily.    Marland Kitchen ENTRESTO 49-51 MG TAKE 1 TABLET BY MOUTH TWICE A DAY 60 tablet 3  . fluticasone (FLONASE) 50 MCG/ACT nasal spray Place 1-2 sprays into both nostrils daily.     . furosemide (LASIX) 40 MG tablet Take once Daily on  Monday, Wednesday, and Friday only. 30 tablet 6  . Ginger, Zingiber officinalis, (GINGER PO) Take 1 tablet by mouth daily as needed (indigestion).    . Melatonin 3 MG TABS Take 6 mg by mouth at bedtime as needed (sleep).     . Multiple Vitamin (MULTIVITAMIN WITH MINERALS) TABS tablet Take 2 tablets by mouth daily.    Marland Kitchen spironolactone (ALDACTONE) 25 MG tablet Take 1 tablet (25 mg total) by mouth daily. 30 tablet 6   No current facility-administered medications for this encounter.     Allergies  Allergen Reactions  . Other Nausea Only and Other (See Comments)    General anesthesia      Social History   Social History  . Marital status: Married    Spouse name: N/A  . Number of children: N/A  . Years of education: N/A   Occupational History  . Not on file.   Social History Main Topics  . Smoking  status: Former Research scientist (life sciences)  . Smokeless tobacco: Never Used     Comment: stopped 34 years ago  . Alcohol use Yes     Comment: moderation 1 drink a day during the week  . Drug use: No  . Sexual activity: Not on file   Other Topics Concern  . Not on file   Social History Narrative  . No narrative on file      Family History  Problem Relation Age of Onset  . Ovarian cancer Mother   . Heart attack Father     Vitals:   08/31/17 1151  BP: 108/66  Pulse: 69  SpO2: 98%  Weight: 250 lb 4 oz (113.5 kg)   Filed Weights   08/31/17 1151  Weight: 250 lb 4 oz (113.5 kg)     PHYSICAL EXAM: General:  Well appearing. No resp difficulty HEENT: normal Neck: supple. JVP 5-6.  Carotids 2+ bilat; no bruits. No lymphadenopathy or thryomegaly appreciated. Cor: PMI nondisplaced. Regular rate & rhythm. No rubs, gallops or murmurs. Lungs: clear Abdomen: obese, soft, nontender, nondistended. No hepatosplenomegaly. No bruits or masses. Good bowel sounds. Extremities: no cyanosis, clubbing, rash, edema Neuro: alert & orientedx3, cranial nerves grossly intact. moves all 4 extremities w/o difficulty. Affect pleasant  ASSESSMENT & PLAN:  1. Chronic systolic CHF: EF 86-76%. 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 SPEP negative. Normal cors in 02/2017. Echo 8/18  improved EF of 40%.  - NYHA II - Volume status stable on exam. ReDS 33% - Continue Lasix to 40 mg every M/W/F.  Can take extra as needed - Continue Coreg to 12.5 mg BID.  - Increase Entresto to 97/103 mg BID - Continue Spiro 25mg  daily.    2. OSA - Continues to wear CPAP, notes increased energy and sleep quality.    3. Chronic kidney disease stage III - Baseline creatinine 1.3-1.5.  - Stable on last labs 2 weeks ago    Glori Bickers, MD 08/31/17

## 2017-09-06 ENCOUNTER — Ambulatory Visit (HOSPITAL_COMMUNITY): Payer: No Typology Code available for payment source

## 2017-10-03 ENCOUNTER — Encounter (HOSPITAL_COMMUNITY): Payer: No Typology Code available for payment source | Admitting: Internal Medicine

## 2017-10-23 ENCOUNTER — Other Ambulatory Visit (HOSPITAL_COMMUNITY): Payer: Self-pay | Admitting: *Deleted

## 2017-10-23 MED ORDER — SPIRONOLACTONE 25 MG PO TABS: 25.0000 mg | ORAL_TABLET | Freq: Every day | ORAL | 6 refills | Status: DC

## 2017-10-23 MED ORDER — FUROSEMIDE 40 MG PO TABS: ORAL_TABLET | ORAL | 6 refills | Status: DC

## 2017-11-07 ENCOUNTER — Other Ambulatory Visit (HOSPITAL_COMMUNITY): Payer: Self-pay | Admitting: *Deleted

## 2017-11-07 MED ORDER — CARVEDILOL 12.5 MG PO TABS: 12.5000 mg | ORAL_TABLET | Freq: Two times a day (BID) | ORAL | 3 refills | Status: DC

## 2017-12-17 IMAGING — RF DG UGI W/ HIGH DENSITY W/KUB
19 of 24 series · 19 of 24 positions shown · non-contrast
Comparison: Lumbar MRI 02/25/2006

CLINICAL DATA: 60-year-old male with nausea, frequent bloating.
Indigestion. Initial encounter.

EXAM:
UPPER GI SERIES WITH KUB
TECHNIQUE: After obtaining a scout radiograph a routine upper GI series was
performed using thin and high density barium.
FLUOROSCOPY TIME:  Fluoroscopy Time:  3 minutes 24 seconds
Radiation Exposure Index (if provided by the fluoroscopic device):
184 d Gy cm2
Number of Acquired Spot Images: 0

[Series 1: run · 1 of 1 slices shown (1 of 19)]
[im 1/1]
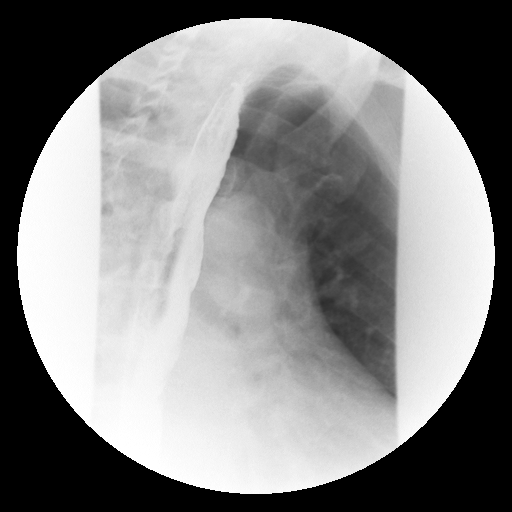

[Series 2: run · 1 of 1 slices shown (2 of 19)]
[im 1/1]
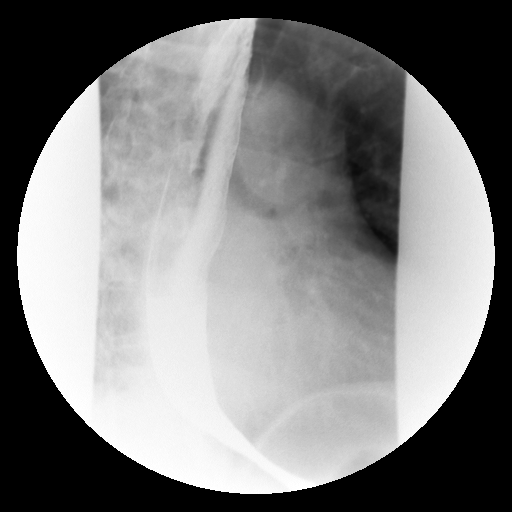

[Series 4: run · 1 of 1 slices shown (3 of 19)]
[im 1/1]
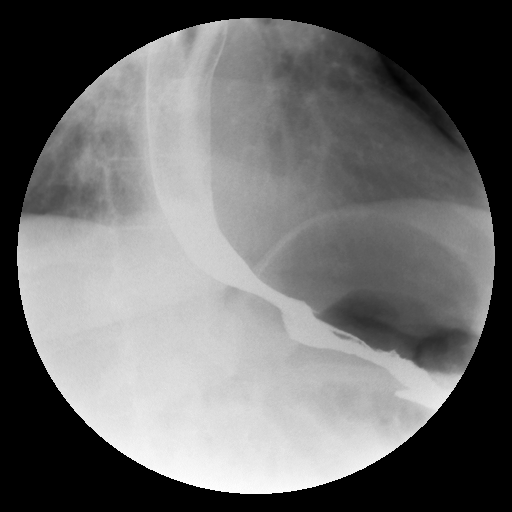

[Series 5: run · 1 of 1 slices shown (4 of 19)]
[im 1/1]
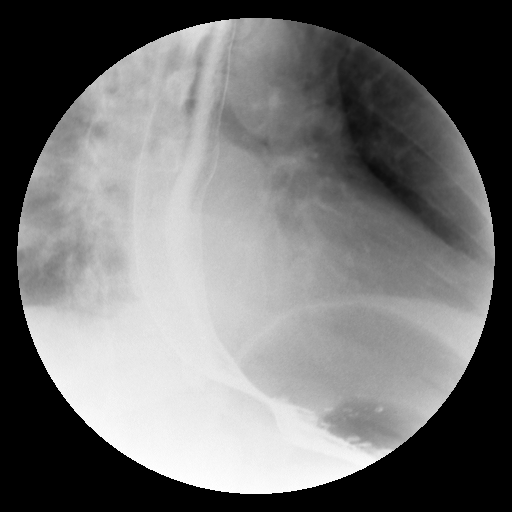

[Series 6: run · 1 of 1 slices shown (5 of 19)]
[im 1/1]
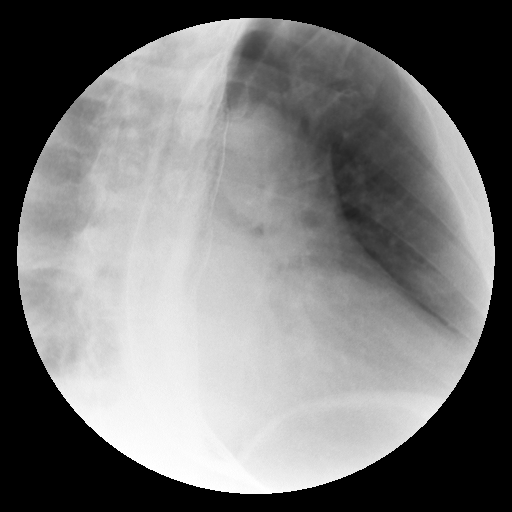

[Series 7: run · 1 of 1 slices shown (6 of 19)]
[im 1/1]
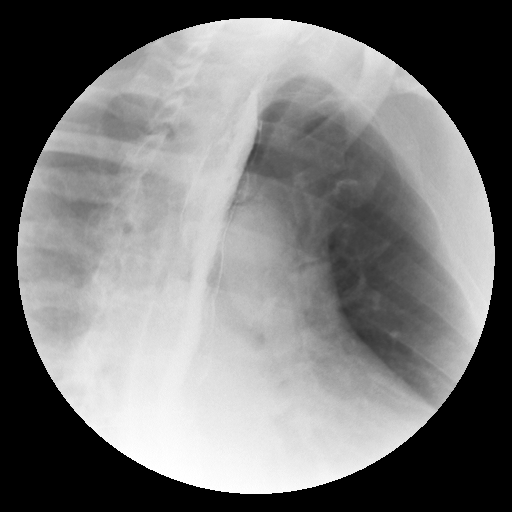

[Series 9: run · 1 of 1 slices shown (7 of 19)]
[im 1/1]
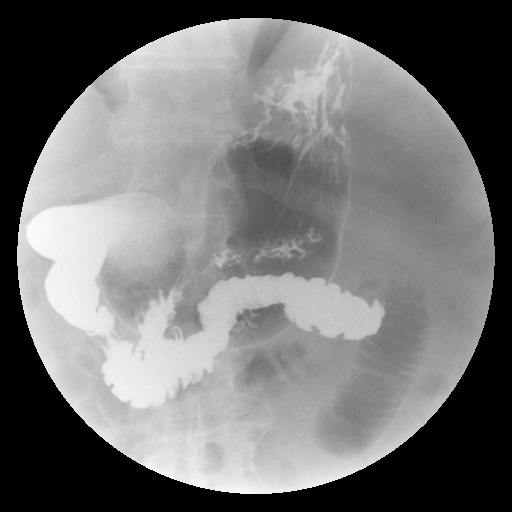

[Series 10: run · 1 of 1 slices shown (8 of 19)]
[im 1/1]
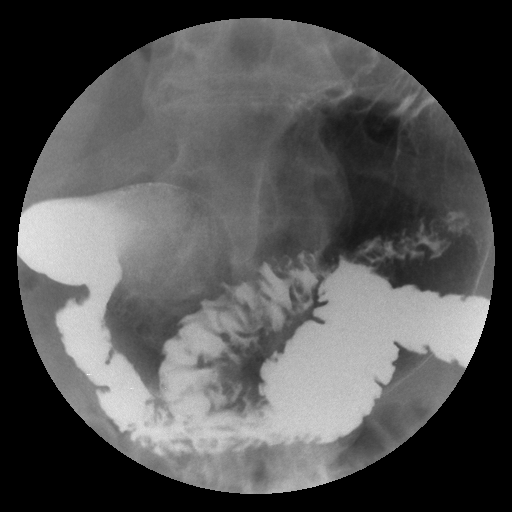

[Series 11: run · 1 of 1 slices shown (9 of 19)]
[im 1/1]
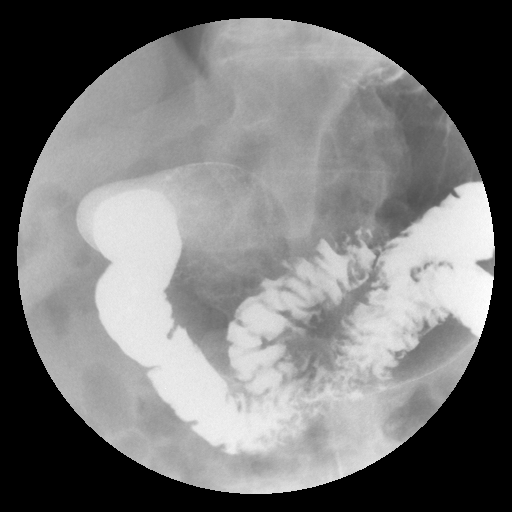

[Series 13: run · 1 of 1 slices shown (10 of 19)]
[im 1/1]
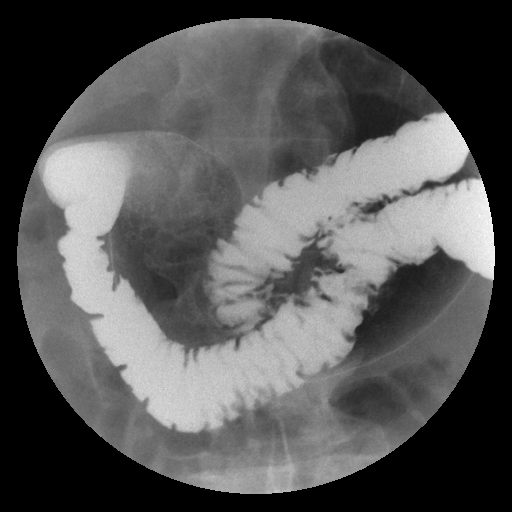

[Series 14: run · 1 of 1 slices shown (11 of 19)]
[im 1/1]
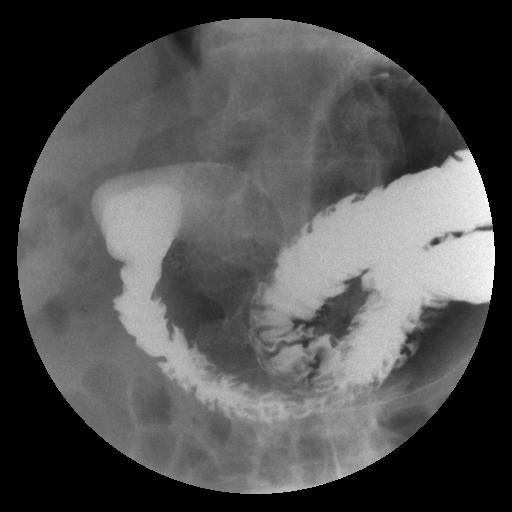

[Series 15: run · 1 of 1 slices shown (12 of 19)]
[im 1/1]
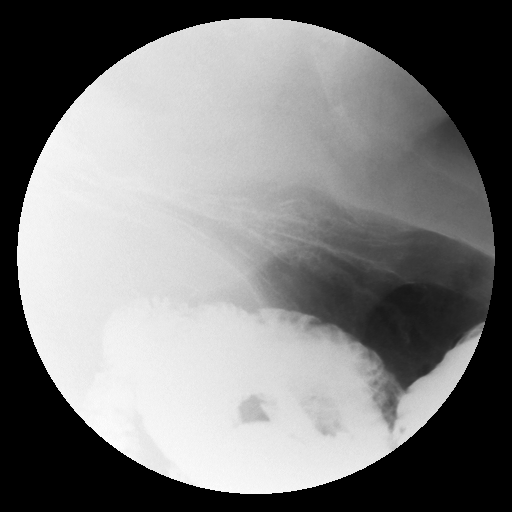

[Series 16: run · 1 of 1 slices shown (13 of 19)]
[im 1/1]
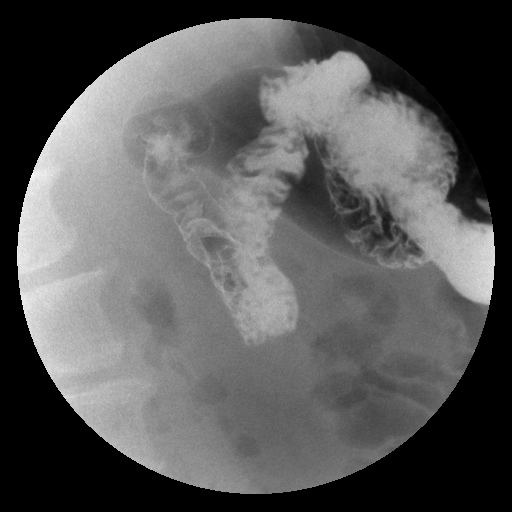

[Series 18: run · 1 of 1 slices shown (14 of 19)]
[im 1/1]
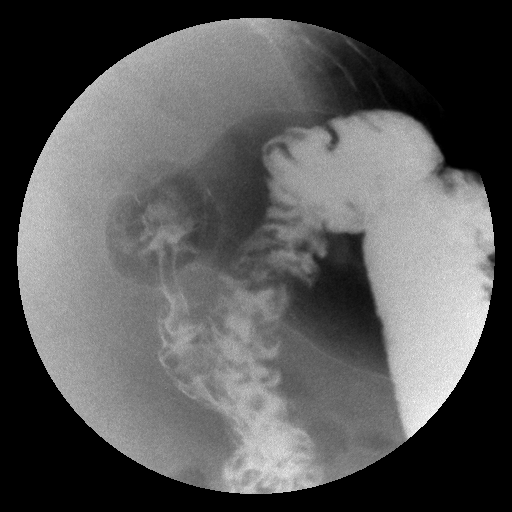

[Series 19: run · 1 of 1 slices shown (15 of 19)]
[im 1/1]
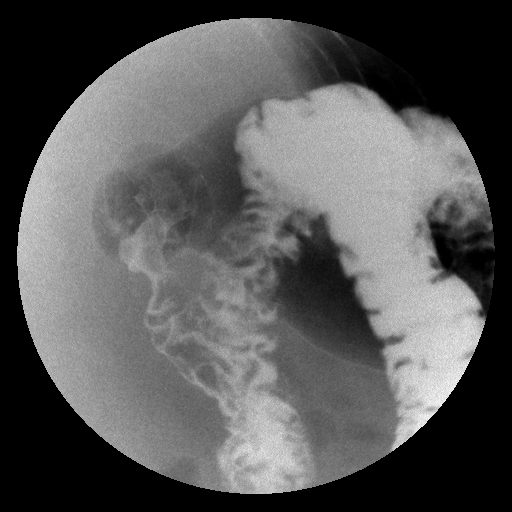

[Series 20: run · 1 of 1 slices shown (16 of 19)]
[im 1/1]
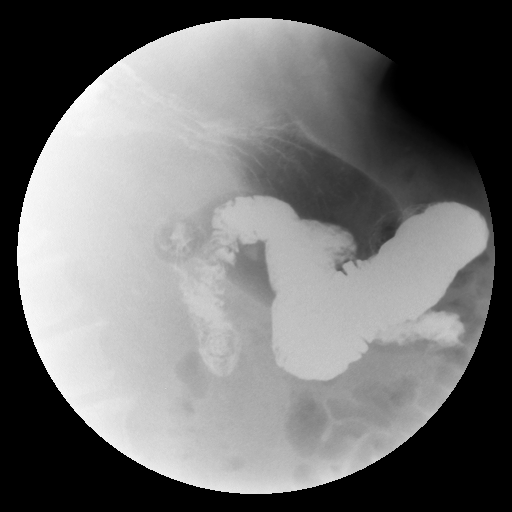

[Series 21: run · 1 of 1 slices shown (17 of 19)]
[im 1/1]
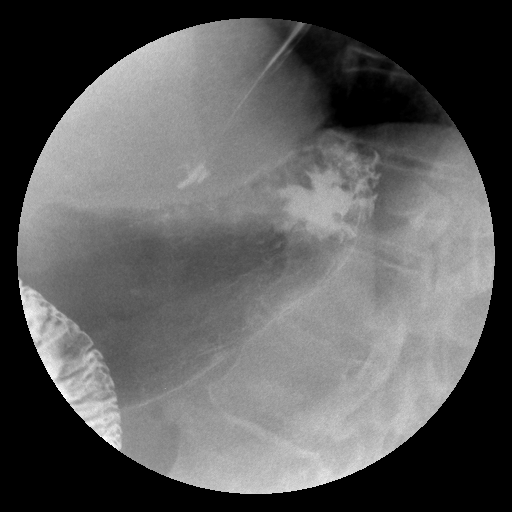

[Series 23: run · 1 of 1 slices shown (18 of 19)]
[im 1/1]
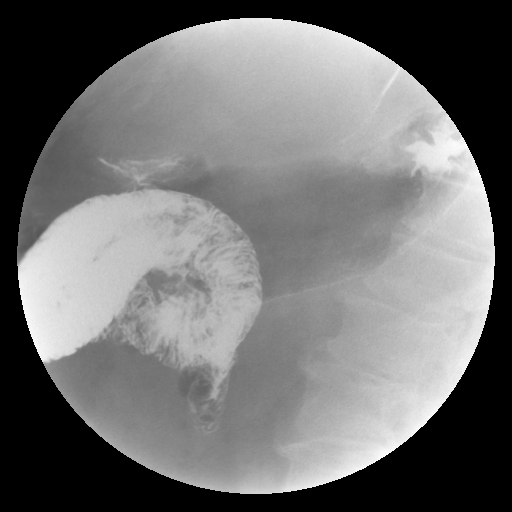

[Series 24: run · 1 of 1 slices shown (19 of 19)]
[im 1/1]
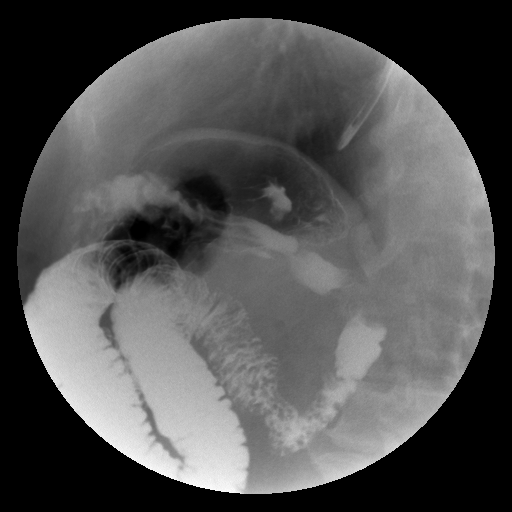

[19 of 24 positions shown; findings below may reference images not displayed]

FINDINGS: Preprocedural scout view of the abdomen. Normal bowel gas pattern;
there is gas throughout multiple nondilated small bowel loops in the
central and left abdomen. Widespread lumbar degenerative endplate
spurring.

A double contrast study was undertaken and the patient tolerated
this well and without difficulty.

No obstruction to the forward flow of contrast throughout the
esophagus and into the stomach. Normal esophageal course and
contour. Normal esophageal mucosal pattern.

Prompt gastric emptying. Adequate gastric coating with contrast.
Normal gastric contour and mucosal pattern. Normal gastroesophageal
junction. Normal duodenum bulb and duodenum segments. Duodenum
mucosal pattern within normal limits.

With prone swallows esophageal motility is normal for age. No
convincing gastroesophageal reflux occurred or was elicited.

Mucosal pattern in the visualized proximal small bowel loops is
within normal limits.
IMPRESSION: Normal upper GI series.

## 2017-12-25 ENCOUNTER — Other Ambulatory Visit: Payer: Self-pay

## 2017-12-25 ENCOUNTER — Encounter (HOSPITAL_COMMUNITY): Payer: Self-pay | Admitting: Internal Medicine

## 2017-12-25 ENCOUNTER — Other Ambulatory Visit (HOSPITAL_COMMUNITY): Payer: Self-pay

## 2017-12-25 ENCOUNTER — Ambulatory Visit (HOSPITAL_COMMUNITY)
Admission: RE | Admit: 2017-12-25 | Discharge: 2017-12-25 | Disposition: A | Discharge status: Home / Self Care | Payer: No Typology Code available for payment source | Source: Ambulatory Visit | Attending: Internal Medicine | Admitting: Internal Medicine

## 2017-12-25 VITALS — BP 116/64 | HR 72 | Wt 259.0 lb

## 2017-12-25 DIAGNOSIS — Z79899 Other long term (current) drug therapy: Secondary | ICD-10-CM | POA: Diagnosis not present

## 2017-12-25 DIAGNOSIS — Z9889 Other specified postprocedural states: Secondary | ICD-10-CM | POA: Insufficient documentation

## 2017-12-25 DIAGNOSIS — Z8249 Family history of ischemic heart disease and other diseases of the circulatory system: Secondary | ICD-10-CM | POA: Insufficient documentation

## 2017-12-25 DIAGNOSIS — Z7982 Long term (current) use of aspirin: Secondary | ICD-10-CM | POA: Diagnosis not present

## 2017-12-25 DIAGNOSIS — I5042 Chronic combined systolic (congestive) and diastolic (congestive) heart failure: Secondary | ICD-10-CM

## 2017-12-25 DIAGNOSIS — K219 Gastro-esophageal reflux disease without esophagitis: Secondary | ICD-10-CM | POA: Insufficient documentation

## 2017-12-25 DIAGNOSIS — Z87891 Personal history of nicotine dependence: Secondary | ICD-10-CM | POA: Insufficient documentation

## 2017-12-25 DIAGNOSIS — I5022 Chronic systolic (congestive) heart failure: Secondary | ICD-10-CM

## 2017-12-25 DIAGNOSIS — N183 Chronic kidney disease, stage 3 (moderate): Secondary | ICD-10-CM | POA: Diagnosis not present

## 2017-12-25 DIAGNOSIS — G4733 Obstructive sleep apnea (adult) (pediatric): Secondary | ICD-10-CM | POA: Diagnosis not present

## 2017-12-25 DIAGNOSIS — I429 Cardiomyopathy, unspecified: Secondary | ICD-10-CM | POA: Insufficient documentation

## 2017-12-25 DIAGNOSIS — I130 Hypertensive heart and chronic kidney disease with heart failure and stage 1 through stage 4 chronic kidney disease, or unspecified chronic kidney disease: Secondary | ICD-10-CM | POA: Insufficient documentation

## 2017-12-25 DIAGNOSIS — Z8041 Family history of malignant neoplasm of ovary: Secondary | ICD-10-CM | POA: Diagnosis not present

## 2017-12-25 DIAGNOSIS — Z888 Allergy status to other drugs, medicaments and biological substances status: Secondary | ICD-10-CM | POA: Insufficient documentation

## 2017-12-25 LAB — BASIC METABOLIC PANEL
ANION GAP: 12 (ref 5–15)
BUN: 41 mg/dL — AB (ref 6–20)
CO2: 18 mmol/L — ABNORMAL LOW (ref 22–32)
Calcium: 9.5 mg/dL (ref 8.9–10.3)
Chloride: 107 mmol/L (ref 101–111)
Creatinine, Ser: 1.55 mg/dL — ABNORMAL HIGH (ref 0.61–1.24)
GFR calc Af Amer: 54 mL/min — ABNORMAL LOW (ref >60)
GFR, EST NON AFRICAN AMERICAN: 46 mL/min — AB (ref >60)
Glucose, Bld: 117 mg/dL — ABNORMAL HIGH (ref 65–99)
POTASSIUM: 4.5 mmol/L (ref 3.5–5.1)
SODIUM: 137 mmol/L (ref 135–145)

## 2017-12-25 NOTE — Addendum Note (Signed)
Encounter addended by: Darron Doom, RN on: 12/25/2017 10:45 AM  Actions taken: Order list changed, Diagnosis association updated, Sign clinical note

## 2017-12-25 NOTE — Patient Instructions (Signed)
Labs today (will call for abnormal results, otherwise no news is good news)  Echocardiogram and Follow up in 4-6 months

## 2017-12-25 NOTE — Progress Notes (Signed)
Advanced Heart Failure Clinic Note   Referring Physician: Dr. Tamala Julian Primary Care: Dr Felipa Eth Primary Cardiologist: Dr. Martinique   HPI:  Joshua Raymond is a 62 y.o. male with PMH of HTN, GERD, NICM (normal cors by cath in 04/2702), chronic systolic CHF (EF 50-09% in April 2017). Previous EF was 40-45% by cath in 2011.   He was admitted 03/22/17-03/27/17 with acute on chronic systolic CHF. Troponin elevated at 1.71, cath showed no obstructive CAD.  Right and left heart cath on 03/24/17 with normal cors, fick cardiac output/index 4.52/1.92. LVEDP 31. Full results below. 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 There was some question of LV thrombus but after further review and review of his Echo with contrast it was felt that there was no LV thrombus, rather trabeculation adjacent to an area of LGE. Therefore, there is no need for anticoagulation.  He was diuresed with IV lasix and discharge weight was 244 pounds.   Echo 8/18 EF 40% (formal read 45-50%)  Mr. Scadden presents today for follow up. Overall doing well but is very concerned about his slow constant weight gain. Up about 20 pound. Doing elliptical or TM 3x/week for 45 mins. No CP or SOB. No edema. Sleeping well. Did this before and lost weight with low carb diet.    Past Medical History:  Diagnosis Date  . CHF (congestive heart failure) ([place])   . Complication of anesthesia   . GERD (gastroesophageal reflux disease)   . Hypertension   . PONV (postoperative nausea and vomiting)     Current Outpatient Medications  Medication Sig Dispense Refill  . aspirin EC 81 MG tablet Take 81 mg by mouth daily.    . carvedilol (COREG) 12.5 MG tablet Take 1 tablet (12.5 mg total) by mouth 2 (two) times daily. 180 tablet 3  . cetirizine (ZYRTEC) 10 MG tablet Take 10 mg by mouth daily.     . Cyanocobalamin (B-12) 1000 MCG TABS Take 1,000 mcg by  mouth 2 (two) times daily.    . fluticasone (FLONASE) 50 MCG/ACT nasal spray Place 1-2 sprays into both nostrils daily.     . furosemide (LASIX) 40 MG tablet Take once Daily on  Monday, Wednesday, and Friday only. 30 tablet 6  . Ginger, Zingiber officinalis, (GINGER PO) Take 1 tablet by mouth daily as needed (indigestion).    . Melatonin 3 MG TABS Take 6 mg by mouth at bedtime as needed (sleep).     . Multiple Vitamin (MULTIVITAMIN WITH MINERALS) TABS tablet Take 2 tablets by mouth daily.    . sacubitril-valsartan (ENTRESTO) 97-103 MG Take 1 tablet by mouth 2 (two) times daily. 60 tablet 6  . spironolactone (ALDACTONE) 25 MG tablet Take 1 tablet (25 mg total) by mouth daily. 30 tablet 6   No current facility-administered medications for this encounter.     Allergies  Allergen Reactions  . Other Nausea Only and Other (See Comments)    General anesthesia      Social History   Socioeconomic History  . Marital status: Married    Spouse name: Not on file  . Number of children: Not on file  . Years of education: Not on file  . Highest education level: Not on file  Social Needs  . Financial resource strain: Not on file  . Food insecurity - worry: Not on file  . Food insecurity - inability: Not  on file  . Transportation needs - medical: Not on file  . Transportation needs - non-medical: Not on file  Occupational History  . Not on file  Tobacco Use  . Smoking status: Former Research scientist (life sciences)  . Smokeless tobacco: Never Used  . Tobacco comment: stopped 34 years ago  Substance and Sexual Activity  . Alcohol use: Yes    Comment: moderation 1 drink a day during the week  . Drug use: No  . Sexual activity: Not on file  Other Topics Concern  . Not on file  Social History Narrative  . Not on file      Family History  Problem Relation Age of Onset  . Ovarian cancer Mother   . Heart attack Father     Vitals:   12/25/17 1024  BP: 116/64  Pulse: 72  SpO2: 100%  Weight: 259 lb (117.5  kg)   Filed Weights   12/25/17 1024  Weight: 259 lb (117.5 kg)     PHYSICAL EXAM: General:  Well appearing. No resp difficulty HEENT: normal Neck: supple. no JVD. Carotids 2+ bilat; no bruits. No lymphadenopathy or thryomegaly appreciated. Cor: PMI nondisplaced. Regular rate & rhythm. No rubs, gallops or murmurs. Lungs: clear Abdomen: obese, soft, nontender, nondistended. No hepatosplenomegaly. No bruits or masses. Good bowel sounds. Extremities: no cyanosis, clubbing, rash, edema Neuro: alert & orientedx3, cranial nerves grossly intact. moves all 4 extremities w/o difficulty. Affect pleasant   ASSESSMENT & PLAN:  1. Chronic systolic CHF: EF 00-93%. 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 SPEP negative. Normal cors in 02/2017. Echo 8/18  improved EF of 45-50% in 8/18.  - NYHA I - Continue Lasix to 40 mg every M/W/F.   - Continue Coreg12.5 mg BID.  - Continue Entresto to 97/103 mg BID - Continue Spiro 25mg  daily.  - See back in 6 months with echo    2. OSA - Continue CPAP  3. Chronic kidney disease stage III - Baseline creatinine 1.2 - 1.4 - Repeat today   Glori Bickers, MD 12/25/17

## 2017-12-26 ENCOUNTER — Other Ambulatory Visit (HOSPITAL_COMMUNITY): Payer: Self-pay | Admitting: Internal Medicine

## 2018-03-30 ENCOUNTER — Other Ambulatory Visit (HOSPITAL_COMMUNITY): Payer: Self-pay | Admitting: Internal Medicine

## 2018-04-25 ENCOUNTER — Encounter (HOSPITAL_COMMUNITY): Payer: Self-pay | Admitting: Internal Medicine

## 2018-04-25 ENCOUNTER — Ambulatory Visit (HOSPITAL_COMMUNITY)
Admission: RE | Admit: 2018-04-25 | Discharge: 2018-04-25 | Disposition: A | Discharge status: Home / Self Care | Payer: PRIVATE HEALTH INSURANCE | Source: Ambulatory Visit | Attending: Internal Medicine | Admitting: Internal Medicine

## 2018-04-25 ENCOUNTER — Ambulatory Visit (HOSPITAL_BASED_OUTPATIENT_CLINIC_OR_DEPARTMENT_OTHER)
Admission: RE | Admit: 2018-04-25 | Discharge: 2018-04-25 | Disposition: A | Discharge status: Home / Self Care | Payer: PRIVATE HEALTH INSURANCE | Source: Ambulatory Visit | Attending: Internal Medicine | Admitting: Internal Medicine

## 2018-04-25 ENCOUNTER — Other Ambulatory Visit: Payer: Self-pay

## 2018-04-25 VITALS — BP 98/60 | HR 72 | Wt 260.8 lb

## 2018-04-25 DIAGNOSIS — I5022 Chronic systolic (congestive) heart failure: Secondary | ICD-10-CM

## 2018-04-25 DIAGNOSIS — G4733 Obstructive sleep apnea (adult) (pediatric): Secondary | ICD-10-CM

## 2018-04-25 DIAGNOSIS — I130 Hypertensive heart and chronic kidney disease with heart failure and stage 1 through stage 4 chronic kidney disease, or unspecified chronic kidney disease: Secondary | ICD-10-CM | POA: Insufficient documentation

## 2018-04-25 DIAGNOSIS — Z87891 Personal history of nicotine dependence: Secondary | ICD-10-CM | POA: Diagnosis not present

## 2018-04-25 DIAGNOSIS — N183 Chronic kidney disease, stage 3 (moderate): Secondary | ICD-10-CM | POA: Diagnosis not present

## 2018-04-25 DIAGNOSIS — K219 Gastro-esophageal reflux disease without esophagitis: Secondary | ICD-10-CM | POA: Insufficient documentation

## 2018-04-25 DIAGNOSIS — Z79899 Other long term (current) drug therapy: Secondary | ICD-10-CM | POA: Insufficient documentation

## 2018-04-25 DIAGNOSIS — Z7982 Long term (current) use of aspirin: Secondary | ICD-10-CM | POA: Insufficient documentation

## 2018-04-25 DIAGNOSIS — E869 Volume depletion, unspecified: Secondary | ICD-10-CM | POA: Insufficient documentation

## 2018-04-25 DIAGNOSIS — I428 Other cardiomyopathies: Secondary | ICD-10-CM | POA: Diagnosis not present

## 2018-04-25 LAB — BASIC METABOLIC PANEL
Anion gap: 9 (ref 5–15)
BUN: 22 mg/dL — ABNORMAL HIGH (ref 6–20)
CALCIUM: 9.4 mg/dL (ref 8.9–10.3)
CO2: 24 mmol/L (ref 22–32)
CREATININE: 1.39 mg/dL — AB (ref 0.61–1.24)
Chloride: 106 mmol/L (ref 101–111)
GFR calc Af Amer: >60 mL/min (ref >60)
GFR calc non Af Amer: 52 mL/min — ABNORMAL LOW (ref >60)
GLUCOSE: 131 mg/dL — AB (ref 65–99)
Potassium: 4.1 mmol/L (ref 3.5–5.1)
Sodium: 139 mmol/L (ref 135–145)

## 2018-04-25 MED ORDER — CARVEDILOL 12.5 MG PO TABS: 12.5000 mg | ORAL_TABLET | Freq: Two times a day (BID) | ORAL | 3 refills | Status: DC

## 2018-04-25 MED ORDER — SACUBITRIL-VALSARTAN 49-51 MG PO TABS: 1.0000 | ORAL_TABLET | Freq: Two times a day (BID) | ORAL | 3 refills | Status: DC

## 2018-04-25 NOTE — Addendum Note (Signed)
Encounter addended by: Scarlette Calico, RN on: 04/25/2018 11:38 AM  Actions taken: Diagnosis association updated, Medication long-term status modified, Order list changed, Sign clinical note

## 2018-04-25 NOTE — Progress Notes (Signed)
Advanced Heart Failure Clinic Note   Referring Physician: Dr. Tamala Julian Primary Care: Dr Felipa Eth Primary Cardiologist: Dr. Martinique   HPI:  Joshua Raymond is a 62 y.o. male with PMH of HTN, GERD, NICM (normal cors by cath in 11/6107), chronic systolic CHF (EF 60-45% in April 2017). Previous EF was 40-45% by cath in 2011.   He was admitted 03/22/17-03/27/17 with acute on chronic systolic CHF. Troponin elevated at 1.71, cath showed no obstructive CAD.  Right and left heart cath on 03/24/17 with normal cors, fick cardiac output/index 4.52/1.92. LVEDP 31. Full results below. 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 There was some question of LV thrombus but after further review and review of his Echo with contrast it was felt that there was no LV thrombus, rather trabeculation adjacent to an area of LGE. Therefore, there is no need for anticoagulation.  He was diuresed with IV lasix and discharge weight was 244 pounds.   Echo 8/18 EF 40% (formal read 45-50%)  He presents for routine f/u. Overall says he is feeling ok. Does not that when he bends over and stands up he get presyncopal. That's been going on for a few weeks. No orthopnea or PND or edema. Continues to gain. Drinking a lot of regular Cokes. No CP. Taking lasix 3x/week Also having tingling in his fingers.   Echo today 55% RV ok Personally reviewed      Past Medical History:  Diagnosis Date  . CHF (congestive heart failure) ([place])   . Complication of anesthesia   . GERD (gastroesophageal reflux disease)   . Hypertension   . PONV (postoperative nausea and vomiting)     Current Outpatient Medications  Medication Sig Dispense Refill  . aspirin EC 81 MG tablet Take 81 mg by mouth daily.    Marland Kitchen atorvastatin (LIPITOR) 10 MG tablet Take 10 mg by mouth daily.    . carvedilol (COREG) 12.5 MG tablet Take 1 tablet (12.5 mg total) by mouth  2 (two) times daily. 180 tablet 3  . cetirizine (ZYRTEC) 10 MG tablet Take 10 mg by mouth daily.     . Cyanocobalamin (B-12) 1000 MCG TABS Take 1,000 mcg by mouth 2 (two) times daily.    Marland Kitchen ENTRESTO 97-103 MG TAKE 1 TABLET BY MOUTH TWICE A DAY 60 tablet 6  . fluticasone (FLONASE) 50 MCG/ACT nasal spray Place 1-2 sprays into both nostrils daily.     . furosemide (LASIX) 40 MG tablet Take once Daily on  Monday, Wednesday, and Friday only. 30 tablet 6  . Ginger, Zingiber officinalis, (GINGER PO) Take 1 tablet by mouth daily as needed (indigestion).    . Melatonin 3 MG TABS Take 6 mg by mouth at bedtime as needed (sleep).     . Multiple Vitamin (MULTIVITAMIN WITH MINERALS) TABS tablet Take 2 tablets by mouth daily.    Marland Kitchen spironolactone (ALDACTONE) 25 MG tablet Take 1 tablet (25 mg total) by mouth daily. 30 tablet 6   No current facility-administered medications for this encounter.     Allergies  Allergen Reactions  . Other Nausea Only and Other (See Comments)    General anesthesia      Social History   Socioeconomic History  . Marital status: Married    Spouse name: Not on file  . Number of children: Not on file  . Years of education: Not on file  . Highest  education level: Not on file  Occupational History  . Not on file  Social Needs  . Financial resource strain: Not on file  . Food insecurity:    Worry: Not on file    Inability: Not on file  . Transportation needs:    Medical: Not on file    Non-medical: Not on file  Tobacco Use  . Smoking status: Former Research scientist (life sciences)  . Smokeless tobacco: Never Used  . Tobacco comment: stopped 34 years ago  Substance and Sexual Activity  . Alcohol use: Yes    Comment: moderation 1 drink a day during the week  . Drug use: No  . Sexual activity: Not on file  Lifestyle  . Physical activity:    Days per week: Not on file    Minutes per session: Not on file  . Stress: Not on file  Relationships  . Social connections:    Talks on phone: Not on  file    Gets together: Not on file    Attends religious service: Not on file    Active member of club or organization: Not on file    Attends meetings of clubs or organizations: Not on file    Relationship status: Not on file  . Intimate partner violence:    Fear of current or ex partner: Not on file    Emotionally abused: Not on file    Physically abused: Not on file    Forced sexual activity: Not on file  Other Topics Concern  . Not on file  Social History Narrative  . Not on file      Family History  Problem Relation Age of Onset  . Ovarian cancer Mother   . Heart attack Father     Vitals:   04/25/18 1103  BP: 98/60  Pulse: 72  SpO2: 100%  Weight: 260 lb 12.8 oz (118.3 kg)   Filed Weights   04/25/18 1103  Weight: 260 lb 12.8 oz (118.3 kg)     PHYSICAL EXAM: General:  Well appearing. No resp difficulty HEENT: normal Neck: supple. no JVD. Carotids 2+ bilat; no bruits. No lymphadenopathy or thryomegaly appreciated. Cor: PMI nondisplaced. Regular rate & rhythm. No rubs, gallops or murmurs. Lungs: clear Abdomen: obese soft, nontender, nondistended. No hepatosplenomegaly. No bruits or masses. Good bowel sounds. Extremities: no cyanosis, clubbing, rash, edema Neuro: alert & orientedx3, cranial nerves grossly intact. moves all 4 extremities w/o difficulty. Affect pleasant  ASSESSMENT & PLAN:  1. Chronic systolic CHF: EF 10-25%. 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 SPEP negative. Normal cors in 02/2017. Echo 8/18  improved EF of 45-50% in 8/18.  - Echo today EF 55% Personally reviewed - Doing well NYHA I - Volume depleted with low BP and orthostasis - Stop lasix - Continue Coreg12.5 mg BID.  - Decrease Entresto to 49/51 mg BID - Continue Spiro 25mg  daily.  - Labs today - If continue to feel dizzy call me in the next week or two - With HF and tingling in hands may have TTR but my suspicion is quite low. If neuropathy continues will get PYP scan at next visit   2. OSA -  Continue CPAP  3. Chronic kidney disease stage III - Baseline creatinine 1.2 - 1.4 - Repeat today   Glori Bickers, MD 04/25/18

## 2018-04-25 NOTE — Progress Notes (Signed)
  Echocardiogram 2D Echocardiogram has been performed.  Merrie Roof F 04/25/2018, 11:01 AM

## 2018-04-25 NOTE — Patient Instructions (Signed)
Stop Furosemide  Decrease Entresto to 49/51 mg Twice daily   Labs today  We will contact you in 6 months to schedule your next appointment.

## 2018-04-25 NOTE — Addendum Note (Signed)
Encounter addended by: Scarlette Calico, RN on: 04/25/2018 11:40 AM  Actions taken: Order list changed

## 2018-05-19 ENCOUNTER — Other Ambulatory Visit (HOSPITAL_COMMUNITY): Payer: Self-pay | Admitting: Internal Medicine

## 2018-07-14 IMAGING — CR DG CHEST 2V
3 series · 3 of 3 positions shown · non-contrast
Comparison: 03/08/2010

CLINICAL DATA: Persistent cough for 1 month

EXAM:
CHEST  2 VIEW

[w chest pa]
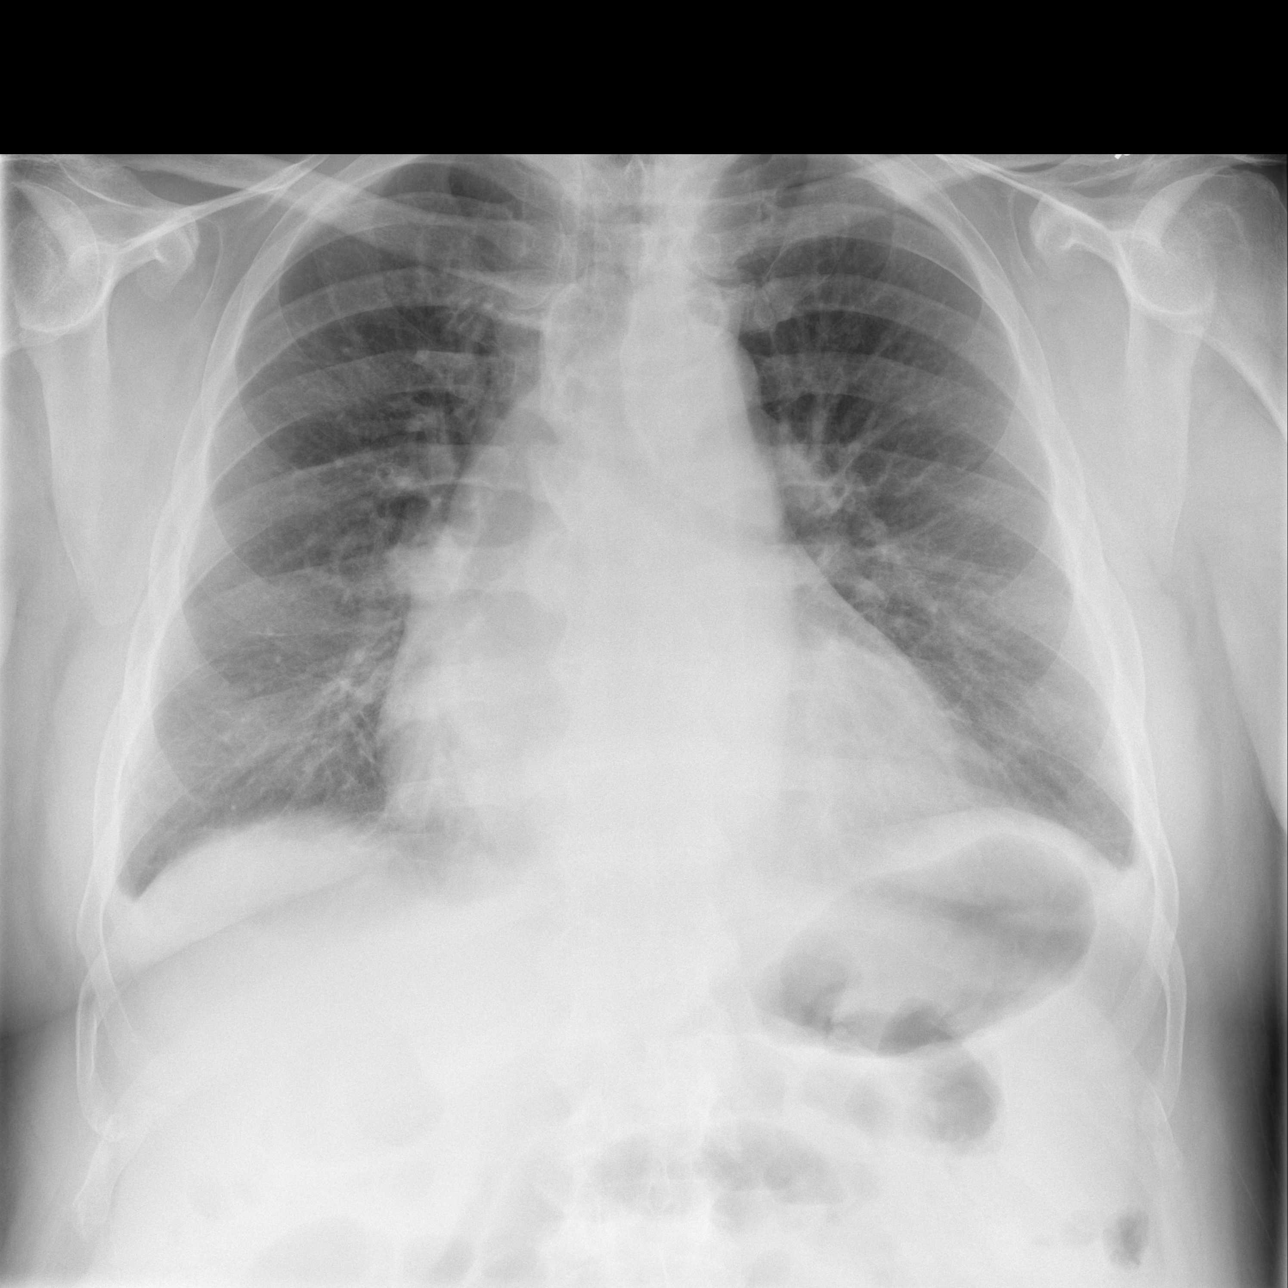

[w chest lat (1 of 2)]
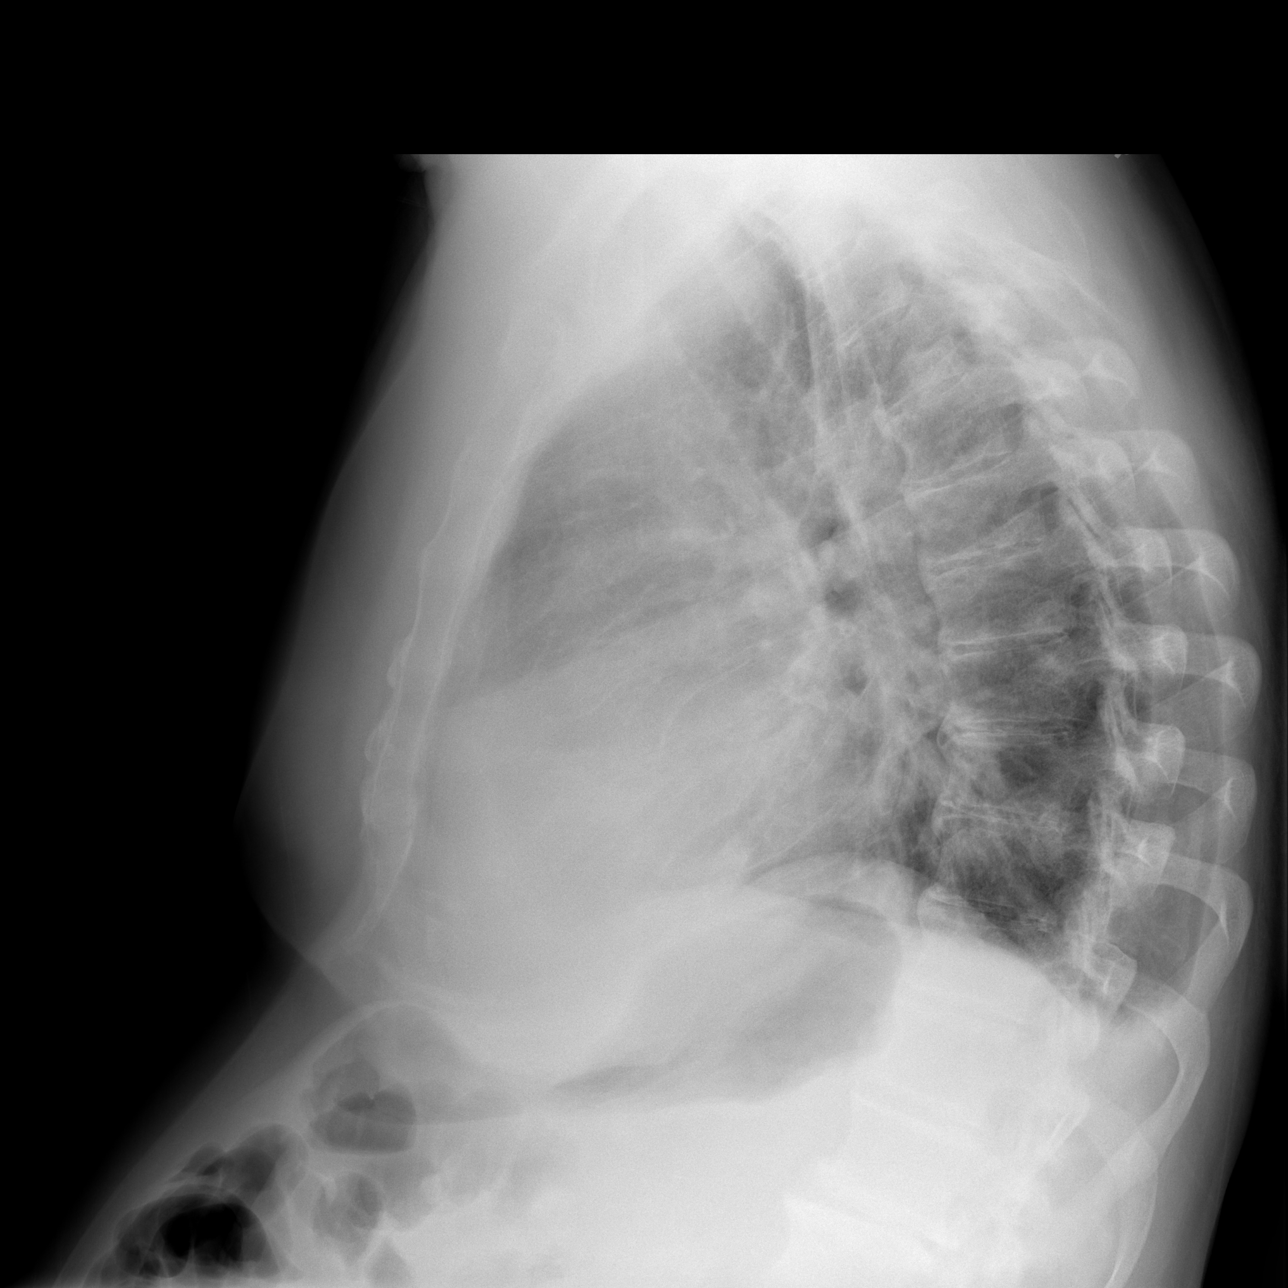

[w chest lat (2 of 2)]
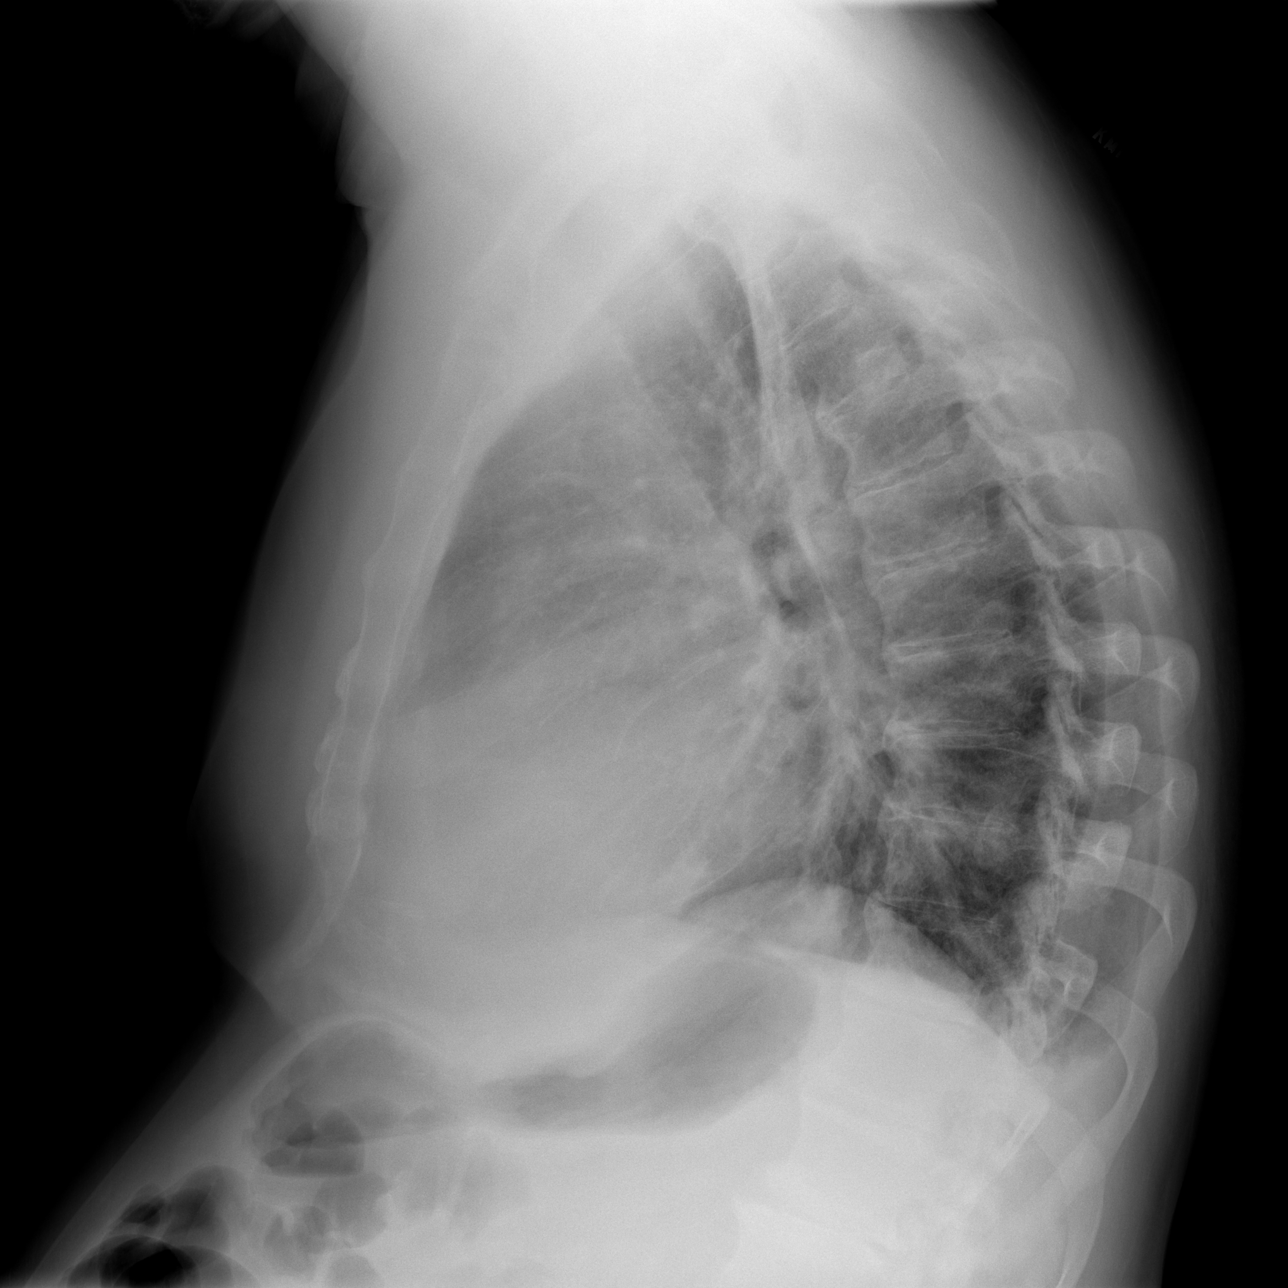

[3 of 3 positions shown; findings below may reference images not displayed]

FINDINGS: Cardiac shadow is mildly enlarged. The lungs are well aerated
bilaterally. Mild blunting of the costophrenic angles is seen
consistent with small effusions. No focal infiltrate is noted. No
bony abnormality is seen.
IMPRESSION: Small bilateral pleural effusions.

## 2018-08-20 ENCOUNTER — Other Ambulatory Visit (HOSPITAL_COMMUNITY): Payer: Self-pay | Admitting: Internal Medicine

## 2018-11-04 ENCOUNTER — Other Ambulatory Visit (HOSPITAL_COMMUNITY): Payer: Self-pay | Admitting: Internal Medicine

## 2018-11-14 ENCOUNTER — Other Ambulatory Visit (HOSPITAL_COMMUNITY): Payer: Self-pay | Admitting: *Deleted

## 2018-12-13 ENCOUNTER — Other Ambulatory Visit (HOSPITAL_COMMUNITY): Payer: Self-pay | Admitting: Internal Medicine

## 2018-12-13 NOTE — Telephone Encounter (Signed)
PT WILL NEED FOLLOW UP APPOINTMENT FOR FUTURE REFILLS 

## 2019-04-15 ENCOUNTER — Other Ambulatory Visit (HOSPITAL_COMMUNITY): Payer: Self-pay | Admitting: Internal Medicine

## 2019-04-27 ENCOUNTER — Other Ambulatory Visit (HOSPITAL_COMMUNITY): Payer: Self-pay | Admitting: Internal Medicine

## 2019-06-24 ENCOUNTER — Other Ambulatory Visit (HOSPITAL_COMMUNITY): Payer: Self-pay | Admitting: Internal Medicine

## 2019-08-21 ENCOUNTER — Other Ambulatory Visit (HOSPITAL_COMMUNITY): Payer: Self-pay | Admitting: Internal Medicine

## 2019-10-11 ENCOUNTER — Other Ambulatory Visit (HOSPITAL_COMMUNITY): Payer: Self-pay | Admitting: Internal Medicine

## 2019-12-15 ENCOUNTER — Other Ambulatory Visit (HOSPITAL_COMMUNITY): Payer: Self-pay | Admitting: Internal Medicine

## 2020-01-13 ENCOUNTER — Other Ambulatory Visit (HOSPITAL_COMMUNITY): Payer: Self-pay | Admitting: Internal Medicine

## 2020-02-06 ENCOUNTER — Other Ambulatory Visit (HOSPITAL_COMMUNITY): Payer: Self-pay | Admitting: Internal Medicine

## 2020-02-11 ENCOUNTER — Other Ambulatory Visit (HOSPITAL_COMMUNITY): Payer: Self-pay | Admitting: Internal Medicine

## 2020-02-14 ENCOUNTER — Telehealth (HOSPITAL_COMMUNITY): Payer: Self-pay | Admitting: *Deleted

## 2020-02-14 NOTE — Telephone Encounter (Signed)
Pt called concerned w/symptoms.  He states he has gained about 25 lbs in the last 3 months and feels very bloated all the time, he states he gets very sob and fatigue with any exertion, even climbing the stairs.  Pt is asymptomatic at rest.  He is taking meds as prescribed and feels this is not an acute issue.  Appt sch for Mon 3/22 at 11 am.

## 2020-02-17 ENCOUNTER — Ambulatory Visit (HOSPITAL_COMMUNITY)
Admission: RE | Admit: 2020-02-17 | Discharge: 2020-02-17 | Disposition: A | Discharge status: Home / Self Care | Payer: Medicare Other | Source: Ambulatory Visit | Attending: Cardiology | Admitting: Cardiology

## 2020-02-17 ENCOUNTER — Encounter (HOSPITAL_COMMUNITY): Payer: Self-pay

## 2020-02-17 ENCOUNTER — Other Ambulatory Visit: Payer: Self-pay

## 2020-02-17 VITALS — BP 144/88 | HR 71 | Ht 72.0 in | Wt 298.0 lb

## 2020-02-17 DIAGNOSIS — R0600 Dyspnea, unspecified: Secondary | ICD-10-CM | POA: Diagnosis not present

## 2020-02-17 DIAGNOSIS — R0609 Other forms of dyspnea: Secondary | ICD-10-CM

## 2020-02-17 DIAGNOSIS — Z87891 Personal history of nicotine dependence: Secondary | ICD-10-CM | POA: Diagnosis not present

## 2020-02-17 DIAGNOSIS — G4733 Obstructive sleep apnea (adult) (pediatric): Secondary | ICD-10-CM

## 2020-02-17 DIAGNOSIS — I5022 Chronic systolic (congestive) heart failure: Secondary | ICD-10-CM | POA: Diagnosis not present

## 2020-02-17 DIAGNOSIS — I130 Hypertensive heart and chronic kidney disease with heart failure and stage 1 through stage 4 chronic kidney disease, or unspecified chronic kidney disease: Secondary | ICD-10-CM | POA: Insufficient documentation

## 2020-02-17 DIAGNOSIS — I5042 Chronic combined systolic (congestive) and diastolic (congestive) heart failure: Secondary | ICD-10-CM | POA: Diagnosis not present

## 2020-02-17 DIAGNOSIS — Z6841 Body Mass Index (BMI) 40.0 and over, adult: Secondary | ICD-10-CM | POA: Insufficient documentation

## 2020-02-17 DIAGNOSIS — R5383 Other fatigue: Secondary | ICD-10-CM | POA: Diagnosis not present

## 2020-02-17 DIAGNOSIS — R0683 Snoring: Secondary | ICD-10-CM | POA: Diagnosis not present

## 2020-02-17 DIAGNOSIS — R0602 Shortness of breath: Secondary | ICD-10-CM | POA: Insufficient documentation

## 2020-02-17 DIAGNOSIS — N183 Chronic kidney disease, stage 3 unspecified: Secondary | ICD-10-CM | POA: Insufficient documentation

## 2020-02-17 DIAGNOSIS — E669 Obesity, unspecified: Secondary | ICD-10-CM | POA: Diagnosis not present

## 2020-02-17 DIAGNOSIS — Z7982 Long term (current) use of aspirin: Secondary | ICD-10-CM | POA: Insufficient documentation

## 2020-02-17 DIAGNOSIS — Z79899 Other long term (current) drug therapy: Secondary | ICD-10-CM | POA: Insufficient documentation

## 2020-02-17 LAB — CBC
HCT: 45.6 % (ref 39.0–52.0)
Hemoglobin: 14.6 g/dL (ref 13.0–17.0)
MCH: 30.9 pg (ref 26.0–34.0)
MCHC: 32 g/dL (ref 30.0–36.0)
MCV: 96.4 fL (ref 80.0–100.0)
Platelets: 193 10*3/uL (ref 150–400)
RBC: 4.73 MIL/uL (ref 4.22–5.81)
RDW: 12.4 % (ref 11.5–15.5)
WBC: 5.9 10*3/uL (ref 4.0–10.5)
nRBC: 0 % (ref 0.0–0.2)

## 2020-02-17 LAB — BRAIN NATRIURETIC PEPTIDE: B Natriuretic Peptide: 69.1 pg/mL (ref 0.0–100.0)

## 2020-02-17 LAB — BASIC METABOLIC PANEL
Anion gap: 13 (ref 5–15)
BUN: 20 mg/dL (ref 8–23)
CO2: 18 mmol/L — ABNORMAL LOW (ref 22–32)
Calcium: 10.1 mg/dL (ref 8.9–10.3)
Chloride: 107 mmol/L (ref 98–111)
Creatinine, Ser: 1.14 mg/dL (ref 0.61–1.24)
GFR calc Af Amer: >60 mL/min (ref >60)
GFR calc non Af Amer: >60 mL/min (ref >60)
Glucose, Bld: 106 mg/dL — ABNORMAL HIGH (ref 70–99)
Potassium: 5 mmol/L (ref 3.5–5.1)
Sodium: 138 mmol/L (ref 135–145)

## 2020-02-17 LAB — T4, FREE: Free T4: 0.75 ng/dL (ref 0.61–1.12)

## 2020-02-17 LAB — TSH: TSH: 2.228 u[IU]/mL (ref 0.350–4.500)

## 2020-02-17 NOTE — Progress Notes (Signed)
Advanced Heart Failure Clinic Note   Referring Physician: Dr. Tamala Julian Primary Care: Dr Felipa Eth Primary Cardiologist: Dr. Martinique  HF MD: Dr Haroldine Laws  HPI:  Joshua Raymond is a 64 y.o. male with PMH of HTN, GERD, NICM (normal cors by cath in [id number]), chronic systolic CHF (EF 123XX123 in April 2017). Previous EF was 40-45% by cath in 2011.   He was admitted 03/22/17-03/27/17 with acute on chronic systolic CHF. Troponin elevated at 1.71, cath showed no obstructive CAD.  Right and left heart cath on 03/24/17 with normal cors, fick cardiac output/index 4.52/1.92. LVEDP 31. Full results below. 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 There was some question of LV thrombus but after further review and review of his Echo with contrast it was felt that there was no LV thrombus, rather trabeculation adjacent to an area of LGE. Therefore, there is no need for anticoagulation.  He was diuresed with IV lasix and discharge weight was 244 pounds.   Echo 8/18 EF 40% (formal read 45-50%) Echo 2019 45-50% RV ok  Today he returns for HF follow up due to increased shortness of breath. Overall feeling fair. Complaining of fatigue and increased SOB with exertion. SOB with inclines. Says he gets short of breath if he walks faster than a stroll. Denies PND/Orthopnea. Stopped using CPAP 1 year ago. Appetite ok. No fever or chills. Weight at home has gone up 25 pounds in 3 months.  Not very active at home. Taking all medications.   Past Medical History:  Diagnosis Date  . CHF (congestive heart failure) ([place])   . Complication of anesthesia   . GERD (gastroesophageal reflux disease)   . Hypertension   . PONV (postoperative nausea and vomiting)     Current Outpatient Medications  Medication Sig Dispense Refill  . aspirin EC 81 MG tablet Take 81 mg by mouth daily.    Marland Kitchen atorvastatin (LIPITOR) 10 MG tablet Take  10 mg by mouth daily.    . carvedilol (COREG) 12.5 MG tablet TAKE 1 TABLET (12.5 MG TOTAL) BY MOUTH 2 (TWO) TIMES DAILY WITH A MEAL. NEEDS APT FOR FURTHER REFILLS 60 tablet 3  . cetirizine (ZYRTEC) 10 MG tablet Take 10 mg by mouth daily.     . Cyanocobalamin (B-12) 1000 MCG TABS Take 1,000 mcg by mouth 2 (two) times daily.    Marland Kitchen ENTRESTO 49-51 MG TAKE 1 TABLET BY MOUTH 2 TIMES DAILY. LAST REFILL WITHOUT OFFICE VISIT. PLEASE CALL [phone] 30 tablet 0  . fluticasone (FLONASE) 50 MCG/ACT nasal spray Place 1-2 sprays into both nostrils daily.     . Melatonin 3 MG TABS Take 6 mg by mouth at bedtime as needed (sleep).     . Multiple Vitamin (MULTIVITAMIN WITH MINERALS) TABS tablet Take 2 tablets by mouth daily.    Marland Kitchen spironolactone (ALDACTONE) 25 MG tablet Take 1 tablet (25 mg total) by mouth daily. Needs appt for futher refills 30 tablet 0   No current facility-administered medications for this encounter.    Allergies  Allergen Reactions  . Other Nausea Only and Other (See Comments)    General anesthesia      Social History   Socioeconomic History  . Marital status: Married    Spouse name: Not on file  . Number of children: Not on file  . Years of education: Not on file  . Highest education level: Not on file  Occupational History  . Not on file  Tobacco Use  . Smoking status: Former Research scientist (life sciences)  . Smokeless tobacco: Never Used  . Tobacco comment: stopped 34 years ago  Substance and Sexual Activity  . Alcohol use: Yes    Comment: moderation 1 drink a day during the week  . Drug use: No  . Sexual activity: Not on file  Other Topics Concern  . Not on file  Social History Narrative  . Not on file   Social Determinants of Health   Financial Resource Strain:   . Difficulty of Paying Living Expenses:   Food Insecurity:   . Worried About Charity fundraiser in the Last Year:   . Arboriculturist in the Last Year:   Transportation Needs:   . Film/video editor (Medical):   Marland Kitchen  Lack of Transportation (Non-Medical):   Physical Activity:   . Days of Exercise per Week:   . Minutes of Exercise per Session:   Stress:   . Feeling of Stress :   Social Connections:   . Frequency of Communication with Friends and Family:   . Frequency of Social Gatherings with Friends and Family:   . Attends Religious Services:   . Active Member of Clubs or Organizations:   . Attends Archivist Meetings:   Marland Kitchen Marital Status:   Intimate Partner Violence:   . Fear of Current or Ex-Partner:   . Emotionally Abused:   Marland Kitchen Physically Abused:   . Sexually Abused:       Family History  Problem Relation Age of Onset  . Ovarian cancer Mother   . Heart attack Father     Vitals:   02/17/20 1046  BP: (!) 144/88  Pulse: 71  SpO2: 96%  Weight: 135.2 kg (298 lb)  Height: 6' (1.829 m)   Filed Weights   02/17/20 1046  Weight: 135.2 kg (298 lb)  Reds Clip 28%    PHYSICAL EXAM: General: Walked in the clinic. No resp difficulty HEENT: normal Neck: supple. no JVD. Carotids 2+ bilat; no bruits. No lymphadenopathy or thryomegaly appreciated. Cor: PMI nondisplaced. Regular rate & rhythm. No rubs, gallops or murmurs. Lungs: clear Abdomen: obese soft, nontender, nondistended. No hepatosplenomegaly. No bruits or masses. Good bowel sounds. Extremities: no cyanosis, clubbing, rash, edema Neuro: alert & orientedx3, cranial nerves grossly intact. moves all 4 extremities w/o difficulty. Affect pleasant  ASSESSMENT & PLAN: 1. Chronic systolic CHF: EF 123XX123. 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 SPEP negative. Normal cors in 02/2017. Echo 03/2018 EF of 45-50% n 8/18.  - Repeat ECHO.  -NYHA III. Volume status stable. Reds Clip 28%.  - Continue Coreg12.5 mg BID.   Delene Loll to 49/51 mg BID  - Continue Spiro 25mg  daily.  -    2. OSA - Hasnt used CPAP in a year. ? Needs different type of mask. - Needs to restart CPAP every night.  -Refer to Dr Radford Pax for sleep apnea.   3. Chronic kidney disease  stage III - Check BMET   4. Fatigue  - Has not been using CPAP. Hopefully he can resume CPAP.  Check TSH, CBC today.   5. Obesity  Body mass index is 40.42 kg/m. Discussed portion control.  Follow up in 6 weeks with Dr Haroldine Laws. Repeat ECHO and check labs today.   Darrick Grinder, NP 02/17/20

## 2020-02-17 NOTE — Patient Instructions (Signed)
It was great to see you today! No medication changes are needed at this time.  Labs today We will only contact you if something comes back abnormal or we need to make some changes. Otherwise no news is good news!   Your physician has requested that you have an echocardiogram. Echocardiography is a painless test that uses sound waves to create images of your heart. It provides your doctor with information about the size and shape of your heart and how well your heart's chambers and valves are working. This procedure takes approximately one hour. There are no restrictions for this procedure.  You have been referred to [facility] Dr Radford Pax [address] [place] [address] [place] [address] [phone]  Your physician recommends that you schedule a follow-up appointment in: 6 weeks with Dr Haroldine Laws  Do the following things EVERYDAY: 1) Weigh yourself in the morning before breakfast. Write it down and keep it in a log. 2) Take your medicines as prescribed 3) Eat low salt foods--Limit salt (sodium) to 2000 mg per day.  4) Stay as active as you can everyday 5) Limit all fluids for the day to less than 2 liters  At the [facility], you and your health needs are our priority. As part of our continuing mission to provide you with exceptional heart care, we have created designated Provider Care Teams. These Care Teams include your primary Cardiologist (physician) and Advanced Practice Providers (APPs- Physician Assistants and Nurse Practitioners) who all work together to provide you with the care you need, when you need it.   You may see any of the following providers on your designated Care Team at your next follow up: Marland Kitchen Dr Glori Bickers . Dr Loralie Champagne . Darrick Grinder, NP . Lyda Jester, PA . Audry Riles, PharmD   Please be sure to bring in all your medications bottles to every appointment.

## 2020-02-17 NOTE — Progress Notes (Signed)
ReDS Vest / Clip - 02/17/20 1000      ReDS Vest / Clip   Station Marker  D    Ruler Value  45    ReDS Value Range  Low volume    ReDS Actual Value  28    Anatomical Comments  sitting

## 2020-02-18 LAB — T3, FREE: T3, Free: 3.2 pg/mL (ref 2.0–4.4)

## 2020-03-04 ENCOUNTER — Other Ambulatory Visit (HOSPITAL_COMMUNITY): Payer: Self-pay | Admitting: Internal Medicine

## 2020-03-16 ENCOUNTER — Ambulatory Visit: Payer: Medicare Other | Admitting: Cardiology

## 2020-03-16 ENCOUNTER — Encounter: Payer: Self-pay | Admitting: Cardiology

## 2020-03-16 ENCOUNTER — Other Ambulatory Visit: Payer: Self-pay

## 2020-03-16 ENCOUNTER — Telehealth: Payer: Self-pay | Admitting: *Deleted

## 2020-03-16 VITALS — BP 118/64 | HR 78 | Ht 72.0 in | Wt 295.0 lb

## 2020-03-16 DIAGNOSIS — G4733 Obstructive sleep apnea (adult) (pediatric): Secondary | ICD-10-CM

## 2020-03-16 DIAGNOSIS — N1831 Chronic kidney disease, stage 3a: Secondary | ICD-10-CM

## 2020-03-16 NOTE — Progress Notes (Addendum)
Cardiology Office Note    Date:  03/16/2020   ID:  Joshua Raymond, DOB 08/13/1956, MRN [id number]  PCP:  Lajean Manes, MD  Cardiologist:  Fransico Him, MD   Chief Complaint  Patient presents with  . Sleep Apnea    History of Present Illness:  Joshua Raymond is a 64 y.o. male who is being seen today for the evaluation of OSA at the request of Clegg, Amy D, NP.  This is a 64yo male with a hx of chronic systolic CHF, OSA not on CPAP, CKD stage 3 and fatigue.  He has been referred by [facility] clinic to establish care with a sleep practitioner and get back on CPAP.  He was diagnosed with OSA over 20 years ago and has had 2 sleep studies with the last being in 2019 and he was placed back on CPAP after being off of it.  He was on auto CPAP but since then has decreased the amount of time he uses the device and stopped using it completely last year due to [place] and having issues with nasal cushion mask causing nasal congestion and blowing too much air into his nose.  He is now having problems with excessive daytime sleepiness and awakening gasping for breath.  He sleeps in a recliner because he has not been able to lay flat.    Past Medical History:  Diagnosis Date  . CHF (congestive heart failure) ([place])   . Complication of anesthesia   . GERD (gastroesophageal reflux disease)   . Hypertension   . PONV (postoperative nausea and vomiting)     Past Surgical History:  Procedure Laterality Date  . bone spur surgery    . CERVICAL DISCECTOMY N/A 1995   C5C6  . COLONOSCOPY WITH PROPOFOL N/A 11/08/2016   Procedure: COLONOSCOPY WITH PROPOFOL;  Surgeon: Garlan Fair, MD;  Location: [facility];  Service: Endoscopy;  Laterality: N/A;  . CORONARY ANGIOPLASTY    . ESOPHAGOGASTRODUODENOSCOPY (EGD) WITH PROPOFOL N/A 11/08/2016   Procedure: ESOPHAGOGASTRODUODENOSCOPY (EGD) WITH PROPOFOL;  Surgeon: Garlan Fair, MD;  Location: [facility];  Service: Endoscopy;  Laterality: N/A;  . KNEE  ARTHROSCOPY Right 1973  . KNEE SURGERY    . LAMINECTOMY N/A 2002  . LAMINECTOMY    . RIGHT/LEFT HEART CATH AND CORONARY ANGIOGRAPHY N/A 03/24/2017   Procedure: Right/Left Heart Cath and Coronary Angiography;  Surgeon: Belva Crome, MD;  Location: [facility];  Service: Cardiovascular;  Laterality: N/A;  . ROTATOR CUFF REPAIR    . SHOULDER ARTHROSCOPY W/ ROTATOR CUFF REPAIR Right 2000  . TENDON LENGTHENING Left 2013   Achilles tendon extension    Current Medications: Current Meds  Medication Sig  . aspirin EC 81 MG tablet Take 81 mg by mouth daily.  Marland Kitchen atorvastatin (LIPITOR) 10 MG tablet Take 10 mg by mouth daily.  . carvedilol (COREG) 12.5 MG tablet TAKE 1 TABLET (12.5 MG TOTAL) BY MOUTH 2 (TWO) TIMES DAILY WITH A MEAL. NEEDS APT FOR FURTHER REFILLS  . cetirizine (ZYRTEC) 10 MG tablet Take 10 mg by mouth daily.   . Cyanocobalamin (B-12) 1000 MCG TABS Take 1,000 mcg by mouth 2 (two) times daily.  . fluticasone (FLONASE) 50 MCG/ACT nasal spray Place 1-2 sprays into both nostrils daily.   . Melatonin 3 MG TABS Take 6 mg by mouth at bedtime as needed (sleep).   . Multiple Vitamin (MULTIVITAMIN WITH MINERALS) TABS tablet Take 2 tablets by mouth daily.  . sacubitril-valsartan (ENTRESTO) 49-51  MG Take 1 tablet by mouth 2 (two) times daily.  Marland Kitchen spironolactone (ALDACTONE) 25 MG tablet Take 1 tablet (25 mg total) by mouth daily. Needs appt for futher refills    Allergies:   Other   Social History   Socioeconomic History  . Marital status: Married    Spouse name: Not on file  . Number of children: Not on file  . Years of education: Not on file  . Highest education level: Not on file  Occupational History  . Not on file  Tobacco Use  . Smoking status: Former Research scientist (life sciences)  . Smokeless tobacco: Never Used  . Tobacco comment: stopped 34 years ago  Substance and Sexual Activity  . Alcohol use: Yes    Comment: moderation 1 drink a day during the week  . Drug use: No  . Sexual activity:  Not on file  Other Topics Concern  . Not on file  Social History Narrative  . Not on file   Social Determinants of Health   Financial Resource Strain:   . Difficulty of Paying Living Expenses:   Food Insecurity:   . Worried About Charity fundraiser in the Last Year:   . Arboriculturist in the Last Year:   Transportation Needs:   . Film/video editor (Medical):   Marland Kitchen Lack of Transportation (Non-Medical):   Physical Activity:   . Days of Exercise per Week:   . Minutes of Exercise per Session:   Stress:   . Feeling of Stress :   Social Connections:   . Frequency of Communication with Friends and Family:   . Frequency of Social Gatherings with Friends and Family:   . Attends Religious Services:   . Active Member of Clubs or Organizations:   . Attends Archivist Meetings:   Marland Kitchen Marital Status:      Family History:  The patient's family history includes Heart attack in his father; Ovarian cancer in his mother.   ROS:   Please see the history of present illness.    ROS All other systems reviewed and are negative.  No flowsheet data found.     PHYSICAL EXAM:   VS:  BP 118/64   Pulse 78   Ht 6' (1.829 m)   Wt 295 lb (133.8 kg)   SpO2 96%   BMI 40.01 kg/m    GEN: Well nourished, well developed, in no acute distress  HEENT: normal  Neck: no JVD, carotid bruits, or masses Cardiac: RRR; no murmurs, rubs, or gallops,no edema.  Intact distal pulses bilaterally.  Respiratory:  clear to auscultation bilaterally, normal work of breathing GI: soft, nontender, nondistended, + BS MS: no deformity or atrophy  Skin: warm and dry, no rash Neuro:  Alert and Oriented x 3, Strength and sensation are intact Psych: euthymic mood, full affect  Wt Readings from Last 3 Encounters:  03/16/20 295 lb (133.8 kg)  02/17/20 298 lb (135.2 kg)  04/25/18 260 lb 12.8 oz (118.3 kg)      Studies/Labs Reviewed:   EKG:  EKG is not ordered today.   Recent Labs: 02/17/2020: B  Natriuretic Peptide 69.1; BUN 20; Creatinine, Ser 1.14; Hemoglobin 14.6; Platelets 193; Potassium 5.0; Sodium 138; TSH 2.228   Lipid Panel    Component Value Date/Time   CHOL 117 03/23/2017 0518   TRIG 62 03/23/2017 0518   HDL 33 (L) 03/23/2017 0518   CHOLHDL 3.5 03/23/2017 0518   VLDL 12 03/23/2017 0518   LDLCALC 72 03/23/2017  0518    Additional studies/ records that were reviewed today include:  OV notes from [facility] clinic    ASSESSMENT:    1. OSA (obstructive sleep apnea)   2. Stage 3a chronic kidney disease   3. Morbid obesity ([place])      PLAN:  In order of problems listed above:  1. OSA -He was dx with OSA 20 years ago and has been on PAP therapy -he had another PSG in 2019 and still on auto PAP but since then has decreased the amount of time he uses the device and -stopped using it completely last year due to [place] and issues with nasal cushion mask causing nasal congestion and blowing too much air into his nose -I will change out his mask for a Respiroinics Dreamwear under the nose FFM -if he continues to have problems then refer back to Dr. Redmond Baseman with ENT -he does not qualify for the hypoglossal nerve stimulator due to morbid obesity -I will get a copy of last sleep study and followup with me in 3 months  2.  CKD stage 3 -followed by PCP  3.  Morbid Obesity -I have encouraged him to get into a routine exercise program and cut back on carbs and portions.     Medication Adjustments/Labs and Tests Ordered: Current medicines are reviewed at length with the patient today.  Concerns regarding medicines are outlined above.  Medication changes, Labs and Tests ordered today are listed in the Patient Instructions below.  There are no Patient Instructions on file for this visit.   Signed, Fransico Him, MD  03/16/2020 1:39 PM    [facility] [address], [place], [place]  [address] Phone: [phone]; Fax: [phone]

## 2020-03-16 NOTE — Telephone Encounter (Signed)
Order placed to adapt health via community message. Request to add to airview made Sleep study requested from [facility].

## 2020-03-16 NOTE — Patient Instructions (Signed)
Medication Instructions:  Your physician recommends that you continue on your current medications as directed. Please refer to the Current Medication list given to you today.  *If you need a refill on your cardiac medications before your next appointment, please call your pharmacy*  Follow-Up: At [facility], you and your health needs are our priority.  As part of our continuing mission to provide you with exceptional heart care, we have created designated Provider Care Teams.  These Care Teams include your primary Cardiologist (physician) and Advanced Practice Providers (APPs -  Physician Assistants and Nurse Practitioners) who all work together to provide you with the care you need, when you need it.   Your next appointment:   3 month(s)  The format for your next appointment:   Virtual Visit   Provider:   Fransico Him, MD

## 2020-03-16 NOTE — Telephone Encounter (Signed)
-----   Message from Sueanne Margarita, MD sent at 03/16/2020  2:00 PM EDT ----- Please order a Respiroinics Dreamwear under the nose FFM.  Please get him on Airview and also get a download in 4 weeks after getting new mask  Get a copy of sleep study from [place] in 2019

## 2020-03-17 NOTE — Telephone Encounter (Signed)
Sleep study received and sent to be scanned.

## 2020-03-18 ENCOUNTER — Other Ambulatory Visit (HOSPITAL_COMMUNITY): Payer: Self-pay | Admitting: Internal Medicine

## 2020-04-03 ENCOUNTER — Encounter (HOSPITAL_COMMUNITY): Payer: Medicare Other | Admitting: Internal Medicine

## 2020-04-03 ENCOUNTER — Ambulatory Visit (HOSPITAL_COMMUNITY): Payer: Medicare Other

## 2020-04-11 ENCOUNTER — Other Ambulatory Visit (HOSPITAL_COMMUNITY): Payer: Self-pay | Admitting: Adult Health

## 2020-06-02 ENCOUNTER — Other Ambulatory Visit (HOSPITAL_COMMUNITY): Payer: Self-pay | Admitting: Internal Medicine

## 2020-06-17 ENCOUNTER — Other Ambulatory Visit: Payer: Self-pay

## 2020-06-17 ENCOUNTER — Encounter (HOSPITAL_COMMUNITY): Payer: Self-pay | Admitting: Internal Medicine

## 2020-06-17 ENCOUNTER — Ambulatory Visit (HOSPITAL_BASED_OUTPATIENT_CLINIC_OR_DEPARTMENT_OTHER)
Admission: RE | Admit: 2020-06-17 | Discharge: 2020-06-17 | Disposition: A | Discharge status: Home / Self Care | Payer: Medicare Other | Source: Ambulatory Visit | Attending: Internal Medicine | Admitting: Internal Medicine

## 2020-06-17 ENCOUNTER — Ambulatory Visit (HOSPITAL_COMMUNITY)
Admission: RE | Admit: 2020-06-17 | Discharge: 2020-06-17 | Disposition: A | Discharge status: Home / Self Care | Payer: Medicare Other | Source: Ambulatory Visit | Attending: Adult Health | Admitting: Adult Health

## 2020-06-17 VITALS — BP 122/78 | HR 76 | Wt 302.0 lb

## 2020-06-17 DIAGNOSIS — K219 Gastro-esophageal reflux disease without esophagitis: Secondary | ICD-10-CM | POA: Insufficient documentation

## 2020-06-17 DIAGNOSIS — I5042 Chronic combined systolic (congestive) and diastolic (congestive) heart failure: Secondary | ICD-10-CM

## 2020-06-17 DIAGNOSIS — Z8249 Family history of ischemic heart disease and other diseases of the circulatory system: Secondary | ICD-10-CM | POA: Diagnosis not present

## 2020-06-17 DIAGNOSIS — Z6841 Body Mass Index (BMI) 40.0 and over, adult: Secondary | ICD-10-CM | POA: Diagnosis not present

## 2020-06-17 DIAGNOSIS — I110 Hypertensive heart disease with heart failure: Secondary | ICD-10-CM | POA: Diagnosis not present

## 2020-06-17 DIAGNOSIS — I428 Other cardiomyopathies: Secondary | ICD-10-CM | POA: Diagnosis not present

## 2020-06-17 DIAGNOSIS — Z7982 Long term (current) use of aspirin: Secondary | ICD-10-CM | POA: Insufficient documentation

## 2020-06-17 DIAGNOSIS — E669 Obesity, unspecified: Secondary | ICD-10-CM | POA: Diagnosis not present

## 2020-06-17 DIAGNOSIS — Z87891 Personal history of nicotine dependence: Secondary | ICD-10-CM | POA: Diagnosis not present

## 2020-06-17 DIAGNOSIS — G4733 Obstructive sleep apnea (adult) (pediatric): Secondary | ICD-10-CM | POA: Diagnosis not present

## 2020-06-17 DIAGNOSIS — I5022 Chronic systolic (congestive) heart failure: Secondary | ICD-10-CM | POA: Insufficient documentation

## 2020-06-17 DIAGNOSIS — Z79899 Other long term (current) drug therapy: Secondary | ICD-10-CM | POA: Diagnosis not present

## 2020-06-17 LAB — BASIC METABOLIC PANEL
Anion gap: 13 (ref 5–15)
BUN: 16 mg/dL (ref 8–23)
CO2: 20 mmol/L — ABNORMAL LOW (ref 22–32)
Calcium: 9.6 mg/dL (ref 8.9–10.3)
Chloride: 103 mmol/L (ref 98–111)
Creatinine, Ser: 1.18 mg/dL (ref 0.61–1.24)
GFR calc Af Amer: >60 mL/min (ref >60)
GFR calc non Af Amer: >60 mL/min (ref >60)
Glucose, Bld: 114 mg/dL — ABNORMAL HIGH (ref 70–99)
Potassium: 4.7 mmol/L (ref 3.5–5.1)
Sodium: 136 mmol/L (ref 135–145)

## 2020-06-17 LAB — HEMOGLOBIN A1C
Hgb A1c MFr Bld: 6.3 % — ABNORMAL HIGH (ref 4.8–5.6)
Mean Plasma Glucose: 134.11 mg/dL

## 2020-06-17 LAB — ECHOCARDIOGRAM COMPLETE
Area-P 1/2: 3.54 cm2
S' Lateral: 4.1 cm
Single Plane A4C EF: 46.4 %

## 2020-06-17 MED ORDER — ENTRESTO 97-103 MG PO TABS: 1.0000 | ORAL_TABLET | Freq: Two times a day (BID) | ORAL | 11 refills | Status: DC

## 2020-06-17 NOTE — Progress Notes (Signed)
Virtual Visit via Telephone Note   This visit type was conducted due to national recommendations for restrictions regarding the COVID-19 Pandemic (e.g. social distancing) in an effort to limit this patient's exposure and mitigate transmission in our community.  Due to his co-morbid illnesses, this patient is at least at moderate risk for complications without adequate follow up.  This format is felt to be most appropriate for this patient at this time.  The patient did not have access to video technology/had technical difficulties with video requiring transitioning to audio format only (telephone).  All issues noted in this document were discussed and addressed.  No physical exam could be performed with this format.  Please refer to the patient's chart for his  consent to telehealth for [facility].   Evaluation Performed:  Follow-up visit  This visit type was conducted due to national recommendations for restrictions regarding the COVID-19 Pandemic (e.g. social distancing).  This format is felt to be most appropriate for this patient at this time.  All issues noted in this document were discussed and addressed.  No physical exam was performed (except for noted visual exam findings with Video Visits).  Please refer to the patient's chart (MyChart message for video visits and phone note for telephone visits) for the patient's consent to telehealth for [facility].  Date:  06/18/2020   ID:  Joshua Raymond, DOB 04-26-1956, MRN [id number]  Patient Location:  Home  Provider location:   [place]  PCP:  Lajean Manes, MD  Cardiologist:  Glori Bickers, MD Electrophysiologist:  None   Chief Complaint:  OSA  History of Present Illness:    Joshua Raymond is a 64 y.o. male who presents via audio/video conferencing for a telehealth visit today.    This is a 64yo male with a hx of chronic systolic CHF, OSA not on CPAP, CKD stage 3 and fatigue.  He was referred by AHF clinic to establish  care with a sleep practitioner and get back on CPAP.  He was diagnosed with OSA over 20 years ago and has had 2 sleep studies with the last being in 2019 and he was placed back on CPAP after being off of it.    He is doing well with his CPAP device and thinks that he has gotten used to it.  He tolerates the mask and feels the pressure is adequate.  Since going on CPAP he feels rested in the am and has no significant daytime sleepiness.  He denies any significant mouth or nasal dryness but wakes up with nasal congestion and sinus headaches.  He does not think that he snores.  He has not been compliant because he wakes up at night and feels the pressure getting really high.  He has to sleep in a recliner to sleep because the tossing and turning dislodges his mask.     The patient does not have symptoms concerning for COVID-19 infection (fever, chills, cough, or new shortness of breath).    Prior CV studies:   The following studies were reviewed today:  PAP compliance download  Past Medical History:  Diagnosis Date  . CHF (congestive heart failure) ([place])   . Complication of anesthesia   . GERD (gastroesophageal reflux disease)   . Hypertension   . PONV (postoperative nausea and vomiting)    Past Surgical History:  Procedure Laterality Date  . bone spur surgery    . CERVICAL DISCECTOMY N/A 1995   C5C6  . COLONOSCOPY WITH PROPOFOL N/A  11/08/2016   Procedure: COLONOSCOPY WITH PROPOFOL;  Surgeon: Garlan Fair, MD;  [facility];  Service: Endoscopy;  Laterality: N/A;  . CORONARY ANGIOPLASTY    . ESOPHAGOGASTRODUODENOSCOPY (EGD) WITH PROPOFOL N/A 11/08/2016   Procedure: ESOPHAGOGASTRODUODENOSCOPY (EGD) WITH PROPOFOL;  Surgeon: Garlan Fair, MD;  [facility];  Service: Endoscopy;  Laterality: N/A;  . KNEE ARTHROSCOPY Right 1973  . KNEE SURGERY    . LAMINECTOMY N/A 2002  . LAMINECTOMY    . RIGHT/LEFT HEART CATH AND CORONARY ANGIOGRAPHY N/A 03/24/2017   Procedure:  Right/Left Heart Cath and Coronary Angiography;  Surgeon: Belva Crome, MD;  Location: [facility];  Service: Cardiovascular;  Laterality: N/A;  . ROTATOR CUFF REPAIR    . SHOULDER ARTHROSCOPY W/ ROTATOR CUFF REPAIR Right 2000  . TENDON LENGTHENING Left 2013   Achilles tendon extension     Current Meds  Medication Sig  . aspirin EC 81 MG tablet Take 81 mg by mouth daily.  Marland Kitchen atorvastatin (LIPITOR) 10 MG tablet Take 10 mg by mouth daily.  . carvedilol (COREG) 12.5 MG tablet TAKE 1 TABLET (12.5 MG TOTAL) BY MOUTH 2 (TWO) TIMES DAILY WITH A MEAL. NEEDS APT FOR FURTHER REFILLS  . cetirizine (ZYRTEC) 10 MG tablet Take 10 mg by mouth daily.   . Cyanocobalamin (B-12) 1000 MCG TABS Take 1,000 mcg by mouth 2 (two) times daily.  . fluticasone (FLONASE) 50 MCG/ACT nasal spray Place 1-2 sprays into both nostrils daily.   . Melatonin 3 MG TABS Take 6 mg by mouth at bedtime as needed (sleep).   . Multiple Vitamin (MULTIVITAMIN WITH MINERALS) TABS tablet Take 2 tablets by mouth daily.  . sacubitril-valsartan (ENTRESTO) 97-103 MG Take 1 tablet by mouth 2 (two) times daily.  Marland Kitchen spironolactone (ALDACTONE) 25 MG tablet Take 1 tablet (25 mg total) by mouth daily. Needs appt for futher refills     Allergies:   Other   Social History   Tobacco Use  . Smoking status: Former Research scientist (life sciences)  . Smokeless tobacco: Never Used  . Tobacco comment: stopped 34 years ago  Substance Use Topics  . Alcohol use: Yes    Comment: moderation 1 drink a day during the week  . Drug use: No     Family Hx: The patient's family history includes Heart attack in his father; Ovarian cancer in his mother.  ROS:   Please see the history of present illness.     All other systems reviewed and are negative.   Labs/Other Tests and Data Reviewed:    Recent Labs: 02/17/2020: B Natriuretic Peptide 69.1; Hemoglobin 14.6; Platelets 193; TSH 2.228 06/17/2020: BUN 16; Creatinine, Ser 1.18; Potassium 4.7; Sodium 136   Recent Lipid  Panel Lab Results  Component Value Date/Time   CHOL 117 03/23/2017 05:18 AM   TRIG 62 03/23/2017 05:18 AM   HDL 33 (L) 03/23/2017 05:18 AM   CHOLHDL 3.5 03/23/2017 05:18 AM   LDLCALC 72 03/23/2017 05:18 AM    Wt Readings from Last 3 Encounters:  06/18/20 (!) 302 lb (137 kg)  06/17/20 (!) 302 lb (137 kg)  03/16/20 295 lb (133.8 kg)     Objective:    Vital Signs:  BP 122/78   Pulse 76   Ht 6' (1.829 m)   Wt (!) 302 lb (137 kg)   SpO2 97%   BMI 40.96 kg/m     ASSESSMENT & PLAN:    1.  OSA -  The patient is tolerating PAP therapy  well without any problems. The PAP download was reviewed today and showed an AHI of 3.6/hr on auto PAP with 33% compliance in using more than 4 hours nightly.  The patient has been using and benefiting from PAP use and will continue to benefit from therapy.  -I am going to place him on a set pressure of 13cm H2O and see if he can sleep better at night -I encouraged him to try to use his device nightly -Recommend using nasal saline spray in the am and before bed to help with nasal congestion  2.  Morbid Obesity -I have encouraged him to cut back on carbs and portions.  -he is now going to the gym and walking on the treadmill  COVID-19 Education: The signs and symptoms of COVID-19 were discussed with the patient and how to seek care for testing (follow up with PCP or arrange E-visit).  The importance of social distancing was discussed today.  Patient Risk:   After full review of this patient's clinical status, I feel that they are at least moderate risk at this time.  Time:   Today, I have spent 20 minutes on telemedicine discussing medical problems including OSA, morbid obesity and reviewing patient's chart including PAP compliance download.  Medication Adjustments/Labs and Tests Ordered: Current medicines are reviewed at length with the patient today.  Concerns regarding medicines are outlined above.  Tests Ordered: No orders of the defined  types were placed in this encounter.  Medication Changes: No orders of the defined types were placed in this encounter.   Disposition:  Follow up in 1 year(s)  Signed, Fransico Him, MD  06/18/2020 8:03 AM    [facility]

## 2020-06-17 NOTE — Patient Instructions (Signed)
Labs done today. We will contact you only if your labs are abnormal.  INCREASE Entresto 97-103mg (1 tablet) by mouth 2 times daily.  No other medication changes were made. Please continue all other current medications as prescribed.   CONGRATULATIONS!!! YOU HAVE GRADUATED THE [facility]!!!  Please follow up with Dr. Radford Pax at [facility].  Do the following things EVERYDAY: 1) Weigh yourself in the morning before breakfast. Write it down and keep it in a log. 2) Take your medicines as prescribed 3) Eat low salt foods--Limit salt (sodium) to 2000 mg per day.  4) Stay as active as you can everyday 5) Limit all fluids for the day to less than 2 liters   At the [facility], you and your health needs are our priority. As part of our continuing mission to provide you with exceptional heart care, we have created designated Provider Care Teams. These Care Teams include your primary Cardiologist (physician) and Advanced Practice Providers (APPs- Physician Assistants and Nurse Practitioners) who all work together to provide you with the care you need, when you need it.   You may see any of the following providers on your designated Care Team at your next follow up: Marland Kitchen Dr Glori Bickers . Dr Loralie Champagne . Darrick Grinder, NP . Lyda Jester, PA . Audry Riles, PharmD   Please be sure to bring in all your medications bottles to every appointment.

## 2020-06-17 NOTE — Progress Notes (Signed)
Advanced Heart Failure Clinic Note   Referring Physician: Dr. Tamala Julian Primary Care: Dr Felipa Eth Primary Cardiologist: Dr. Martinique  HF MD: Dr Haroldine Laws  HPI:  Joshua Raymond is a 64 y.o. male with PMH of HTN, GERD, NICM (normal cors by cath in 05/262), chronic systolic CHF (EF 78-58% in April 2017). Previous EF was 40-45% by cath in 2011.   He was admitted 03/22/17-03/27/17 with acute on chronic systolic CHF. Troponin elevated at 1.71, cath showed no obstructive CAD.  Right and left heart cath on 03/24/17 with normal cors, fick cardiac output/index 4.52/1.92. LVEDP 31. Full results below. 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 There was some question of LV thrombus but after further review and review of his Echo with contrast it was felt that there was no LV thrombus, rather trabeculation adjacent to an area of LGE. Therefore, there is no need for anticoagulation.  He was diuresed with IV lasix and discharge weight was 244 pounds.   Echo 8/18 EF 40% (formal read 45-50%) Echo 2019 45-50% RV ok  Today he returns for HF follow up. Restarted CPAP in April. Wearing about 50% of the time. Walking on TM 2-3x/week. For 40 mins 2.5-40mph. No CP. Breathing much better since March but still SOB if he pushes himself. Hasn't been able to lose weight. Drinks 1/2 gallon of sweet tea per day.   Echo 06/17/20 EF 50% RV ok. Grade I DD. Personally reviewed    Past Medical History:  Diagnosis Date  . CHF (congestive heart failure) ([place])   . Complication of anesthesia   . GERD (gastroesophageal reflux disease)   . Hypertension   . PONV (postoperative nausea and vomiting)     Current Outpatient Medications  Medication Sig Dispense Refill  . aspirin EC 81 MG tablet Take 81 mg by mouth daily.    Marland Kitchen atorvastatin (LIPITOR) 10 MG tablet Take 10 mg by mouth daily.    . carvedilol (COREG) 12.5 MG tablet TAKE  1 TABLET (12.5 MG TOTAL) BY MOUTH 2 (TWO) TIMES DAILY WITH A MEAL. NEEDS APT FOR FURTHER REFILLS 60 tablet 3  . cetirizine (ZYRTEC) 10 MG tablet Take 10 mg by mouth daily.     . Cyanocobalamin (B-12) 1000 MCG TABS Take 1,000 mcg by mouth 2 (two) times daily.    Marland Kitchen ENTRESTO 49-51 MG TAKE 1 TABLET BY MOUTH 2 (TWO) TIMES DAILY. 180 tablet 0  . fluticasone (FLONASE) 50 MCG/ACT nasal spray Place 1-2 sprays into both nostrils daily.     . Melatonin 3 MG TABS Take 6 mg by mouth at bedtime as needed (sleep).     . Multiple Vitamin (MULTIVITAMIN WITH MINERALS) TABS tablet Take 2 tablets by mouth daily.    Marland Kitchen spironolactone (ALDACTONE) 25 MG tablet Take 1 tablet (25 mg total) by mouth daily. Needs appt for futher refills 90 tablet 3   No current facility-administered medications for this encounter.    Allergies  Allergen Reactions  . Other Nausea Only and Other (See Comments)    General anesthesia      Social History   Socioeconomic History  . Marital status: Married    Spouse name: Not on file  . Number of children: Not on file  . Years of education: Not on file  . Highest education level: Not on file  Occupational History  . Not on file  Tobacco Use  . Smoking  status: Former Research scientist (life sciences)  . Smokeless tobacco: Never Used  . Tobacco comment: stopped 34 years ago  Substance and Sexual Activity  . Alcohol use: Yes    Comment: moderation 1 drink a day during the week  . Drug use: No  . Sexual activity: Not on file  Other Topics Concern  . Not on file  Social History Narrative  . Not on file   Social Determinants of Health   Financial Resource Strain:   . Difficulty of Paying Living Expenses:   Food Insecurity:   . Worried About Charity fundraiser in the Last Year:   . Arboriculturist in the Last Year:   Transportation Needs:   . Film/video editor (Medical):   Marland Kitchen Lack of Transportation (Non-Medical):   Physical Activity:   . Days of Exercise per Week:   . Minutes of Exercise per  Session:   Stress:   . Feeling of Stress :   Social Connections:   . Frequency of Communication with Friends and Family:   . Frequency of Social Gatherings with Friends and Family:   . Attends Religious Services:   . Active Member of Clubs or Organizations:   . Attends Archivist Meetings:   Marland Kitchen Marital Status:   Intimate Partner Violence:   . Fear of Current or Ex-Partner:   . Emotionally Abused:   Marland Kitchen Physically Abused:   . Sexually Abused:       Family History  Problem Relation Age of Onset  . Ovarian cancer Mother   . Heart attack Father     Vitals:   06/17/20 1000  BP: 122/78  Pulse: 76  SpO2: 97%  Weight: (!) 137 kg (302 lb)   Filed Weights   06/17/20 1000  Weight: (!) 137 kg (302 lb)  Reds Clip 28%    PHYSICAL EXAM: General:  Well appearing. No resp difficulty HEENT: normal Neck: supple. no JVD. Carotids 2+ bilat; no bruits. No lymphadenopathy or thryomegaly appreciated. Cor: PMI nondisplaced. Regular rate & rhythm. No rubs, gallops or murmurs. Lungs: clear Abdomen: obese soft, nontender, nondistended. No hepatosplenomegaly. No bruits or masses. Good bowel sounds. Extremities: no cyanosis, clubbing, rash, edema Neuro: alert & orientedx3, cranial nerves grossly intact. moves all 4 extremities w/o difficulty. Affect pleasant   ASSESSMENT & PLAN: 1. Chronic systolic CHF: EF 89-38%. 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 SPEP negative. Normal cors in 02/2017. Echo 03/2018 EF of 45-50% n 8/18.  - Echo today (06/17/20) EF 50% RV ok. Grade I DD. Personally reviewed - Much improved NYHA II - Volume status ok  - Continue Coreg12.5 mg BID.  - Continue Entresto to 49/51 mg BID  - Continue Spiro 25mg  daily.  -  Recheck labs. (last K was 5.0) - Can consider SGLT2i in the future with upcoming HFpEF data  2. OSA - Back on CPAP - Followed by Dr. Radford Pax  3. Obesity  Body mass index is 40.96 kg/m. - Discussed plan to try and limit sweet tea consumption    Glori Bickers, MD 06/17/20

## 2020-06-17 NOTE — Progress Notes (Signed)
  Echocardiogram 2D Echocardiogram has been performed.  Joshua Raymond 06/17/2020, 9:55 AM

## 2020-06-18 ENCOUNTER — Telehealth (INDEPENDENT_AMBULATORY_CARE_PROVIDER_SITE_OTHER): Payer: Medicare Other | Admitting: Cardiology

## 2020-06-18 ENCOUNTER — Encounter: Payer: Self-pay | Admitting: Cardiology

## 2020-06-18 ENCOUNTER — Other Ambulatory Visit: Payer: Self-pay

## 2020-06-18 VITALS — BP 122/78 | HR 76 | Ht 72.0 in | Wt 302.0 lb

## 2020-06-18 DIAGNOSIS — G4733 Obstructive sleep apnea (adult) (pediatric): Secondary | ICD-10-CM

## 2020-06-22 ENCOUNTER — Telehealth: Payer: Self-pay | Admitting: *Deleted

## 2020-06-22 DIAGNOSIS — G4733 Obstructive sleep apnea (adult) (pediatric): Secondary | ICD-10-CM

## 2020-06-22 NOTE — Telephone Encounter (Signed)
order placed to adapt health to Change CPAP from auto to CPAP at 13cm H2O and get a download in 3 weeks.   3 week reminder made

## 2020-06-22 NOTE — Telephone Encounter (Signed)
-----   Message from Sueanne Margarita, MD sent at 06/18/2020  8:15 AM EDT ----- Change CPAP from auto to CPAP at 13cm H2O and get a download in 3 weeks.  Followup with me in 1 year

## 2020-07-06 ENCOUNTER — Other Ambulatory Visit (HOSPITAL_COMMUNITY): Payer: Self-pay | Admitting: Cardiology

## 2020-07-06 DIAGNOSIS — G4733 Obstructive sleep apnea (adult) (pediatric): Secondary | ICD-10-CM

## 2020-11-17 ENCOUNTER — Encounter: Payer: Self-pay | Admitting: Dietician

## 2020-11-17 ENCOUNTER — Other Ambulatory Visit: Payer: Self-pay

## 2020-11-17 ENCOUNTER — Ambulatory Visit: Payer: Medicare Other

## 2020-11-17 ENCOUNTER — Encounter: Payer: Medicare Other | Attending: Geriatric Medicine | Admitting: Dietician

## 2020-11-17 VITALS — Ht 72.0 in | Wt 295.6 lb

## 2020-11-17 DIAGNOSIS — E119 Type 2 diabetes mellitus without complications: Secondary | ICD-10-CM

## 2020-11-17 NOTE — Patient Instructions (Signed)
Choose 4 carb choices (60g) for each meal. Breakfast, lunch, and dinner.  Build your meals based on the proportions of the balanced plate.  Start with your 4 carb choices. Then pair it with a protein. Finish your meal with half of it being non-starchy veggies  Work towards increasing your physical activity.

## 2020-11-17 NOTE — Progress Notes (Signed)
Diabetes Self-Management Education  Visit Type: First/Initial  Appt. Start Time: 1400 Appt. End Time: 1520  11/17/2020  [name], identified by name and date of birth, is a 64 y.o. male with a diagnosis of Diabetes: Type 2.   ASSESSMENT  Pt had CHF in 2018. Reports a lot of fluid retention for a month before failure, was medicated and his blood pressure and kidney function has returned to normal. Pt states he has had tests since his heart failure, and his ejection fraction is back to normal. Pt has been prediabetic for years. Just started metFormin one month ago. Is taking after meals with no side effects. Pt has a follow-up for his A1c in June. Pt considers his health "fair" because he is overweight, has a history of CHF. Pt reports occasionally snacking at night. Now snacks on almonds or walnuts, sometimes pork rhinds. Pt was going to the gym 3x a week before the pandemic, but has become sedentary over the past year and a half. Pt does not enjoy walking. Pt does yardwork around the house. Pt reports tiring quickly doing physical activity, but has no residual side effects from his CHF.  Height 6' (1.829 m), weight 295 lb 9.6 oz (134.1 kg). Body mass index is 40.09 kg/m.   Diabetes Self-Management Education - 11/17/20 1415      Visit Information   Visit Type First/Initial      Initial Visit   Diabetes Type Type 2    Are you currently following a meal plan? No    Are you taking your medications as prescribed? Yes    Date Diagnosed Nov. 2021   Been prediabetic for years     Health Coping   How would you rate your overall health? Fair      Psychosocial Assessment   Patient Belief/Attitude about Diabetes Motivated to manage diabetes    Self-care barriers None    Self-management support Doctor's office;Family    Other persons present Patient    Patient Concerns Nutrition/Meal planning;Healthy Lifestyle    Special Needs None    Preferred Learning Style No preference  indicated    Learning Readiness Ready    How often do you need to have someone help you when you read instructions, pamphlets, or other written materials from your doctor or pharmacy? 1 - Never    What is the last grade level you completed in school? 4 years college      Pre-Education Assessment   Patient understands the diabetes disease and treatment process. Needs Instruction    Patient understands incorporating nutritional management into lifestyle. Needs Instruction    Patient undertands incorporating physical activity into lifestyle. Needs Instruction    Patient understands using medications safely. Needs Instruction    Patient understands monitoring blood glucose, interpreting and using results Needs Instruction    Patient understands prevention, detection, and treatment of acute complications. Needs Instruction    Patient understands prevention, detection, and treatment of chronic complications. Needs Instruction    Patient understands how to develop strategies to address psychosocial issues. Needs Instruction    Patient understands how to develop strategies to promote health/change behavior. Needs Instruction      Complications   Last HgB A1C per patient/outside source 6.5 %    How often do you check your blood sugar? 0 times/day (not testing)    Have you had a dilated eye exam in the past 12 months? Yes    Have you had a dental exam in the past 12  months? Yes    Are you checking your feet? Yes    How many days per week are you checking your feet? 7      Dietary Intake   Breakfast 2 eggs, 2 slices [place] bacon, diet coke    Lunch Cheese sandwich, [place] & swiss, white bread, unsweet tea w/ Splenda    Dinner [employer], red sauce, Unsweetened tea w/ Splenda    Beverage(s) ~76 oz of fluid daily      Exercise   Exercise Type ADL's    How many days per week to you exercise? 0    How many minutes per day do you exercise? 0    Total minutes per week of exercise 0      Patient  Education   Previous Diabetes Education No    Disease state  Definition of diabetes, type 1 and 2, and the diagnosis of diabetes;Factors that contribute to the development of diabetes    Nutrition management  Role of diet in the treatment of diabetes and the relationship between the three main macronutrients and blood glucose level;Food label reading, portion sizes and measuring food.;Carbohydrate counting    Physical activity and exercise  Role of exercise on diabetes management, blood pressure control and cardiac health.    Medications Reviewed patients medication for diabetes, action, purpose, timing of dose and side effects.    Monitoring Identified appropriate SMBG and/or A1C goals.    Chronic complications Relationship between chronic complications and blood glucose control      Individualized Goals (developed by patient)   Physical Activity Exercise 1-2 times per week    Medications take my medication as prescribed    Monitoring  Not Applicable      Post-Education Assessment   Patient understands the diabetes disease and treatment process. Demonstrates understanding / competency    Patient understands incorporating nutritional management into lifestyle. Needs Review    Patient undertands incorporating physical activity into lifestyle. Demonstrates understanding / competency    Patient understands using medications safely. Needs Review    Patient understands prevention, detection, and treatment of chronic complications. Needs Review    Patient understands how to develop strategies to address psychosocial issues. Needs Review    Patient understands how to develop strategies to promote health/change behavior. Needs Review      Outcomes   Expected Outcomes Demonstrated interest in learning. Expect positive outcomes    Future DMSE 2 months    Program Status Not Completed           Individualized Plan for Diabetes Self-Management Training:   Learning Objective:  Patient will have a  greater understanding of diabetes self-management. Patient education plan is to attend individual and/or group sessions per assessed needs and concerns.   Plan:   Patient Instructions  Choose 4 carb choices (60g) for each meal. Breakfast, lunch, and dinner.  Build your meals based on the proportions of the balanced plate.  Start with your 4 carb choices. Then pair it with a protein. Finish your meal with half of it being non-starchy veggies  Work towards increasing your physical activity.   Expected Outcomes:  Demonstrated interest in learning. Expect positive outcomes  Education material provided: Meal plan card and My Plate  If problems or questions, patient to contact team via:  Phone and Email  Future DSME appointment: 2 months

## 2020-11-24 ENCOUNTER — Ambulatory Visit: Payer: Medicare Other

## 2020-12-01 ENCOUNTER — Ambulatory Visit: Payer: Medicare Other

## 2021-01-19 ENCOUNTER — Other Ambulatory Visit: Payer: Self-pay

## 2021-01-19 ENCOUNTER — Encounter: Payer: Medicare Other | Attending: Geriatric Medicine | Admitting: Dietician

## 2021-01-19 ENCOUNTER — Encounter: Payer: Self-pay | Admitting: Dietician

## 2021-01-19 VITALS — Ht 72.0 in | Wt 284.5 lb

## 2021-01-19 DIAGNOSIS — E119 Type 2 diabetes mellitus without complications: Secondary | ICD-10-CM

## 2021-01-19 NOTE — Patient Instructions (Addendum)
Begin to increase your walking as the weather improves.   Work to consistently hit a maximum of 2,000 mg of sodium daily.  Try Ezekiel bread for a great, low sodium bread option.  Try to move the carbs from your snacks to your meals consistently.  Consider buying a cheap blood glucose meter to check your glucose. Check it first thing in the morning before you eat anything.

## 2021-01-19 NOTE — Progress Notes (Signed)
Diabetes Self-Management Education  Visit Type: Follow-up  Appt. Start Time: 0930 Appt. End Time: 1000  01/19/2021  [name], identified by name and date of birth, is a 65 y.o. male with a diagnosis of Diabetes: Type 2.   ASSESSMENT  Pt has lost 10 pounds in the last monthPt reports weighing daily and states that their weight fluctuates about 3 pounds or so daily. Pt reports downloading the Fooducate app and has been using it for a month. Pt states the accountability of entering food choices into this app has really helped change their dietary pattern. Pt reports sodium intake has shown to be too high, app has their limit at 1,500 mg a day. Pt states really enjoying foods with fat and sodium, but has limited them.  Pt reports paying a lot more attention to their carb intake and is trying to hit the recommended 60 grams of carb per meal. Pt states they have been using their food tracking app to eat consistent carbs. Pt reports lowering intake of artificial sweetener and states their weight loss increased as a result. Pt reports cutting out deli meats. Pt reports their hunger is not as intense anymore, and has stopped snacking like they used to. Pt reports beginning to walk more in 10 minute incriments and plans to increase activity as the weather improves. Pt reports having OSA but does not like their CPAP. Will be seeing sleep doctor about this.  Pt not checking BG, but interested in getting a meter.  Height 6' (1.829 m), weight 284 lb 8 oz (129 kg). Body mass index is 38.59 kg/m.   Diabetes Self-Management Education - 01/19/21 1001      Visit Information   Visit Type Follow-up      Initial Visit   Diabetes Type Type 2    Are you currently following a meal plan? Yes    Are you taking your medications as prescribed? Yes      Health Coping   How would you rate your overall health? Good      Psychosocial Assessment   Patient Belief/Attitude about Diabetes Motivated to manage  diabetes    Self-care barriers None    Other persons present Patient    Learning Readiness Change in progress      Complications   How often do you check your blood sugar? Not recommended by provider      Dietary Intake   Breakfast 2 oz pomegranite juice, 3 eggs, 1 sesame bagel    Snack (morning) 8 oz OJ    Lunch Tossed salad, tomatoes, carrots, 2 eggs, cheddar cheese    Snack (afternoon) 1 cup frosted mini wheats    Dinner Tossed salad with mozzerella, walnuts, greek yogurt dressing    Beverage(s) OJ, pomegranite juice, 2 diet sodas, ~50 oz water      Exercise   Exercise Type ADL's;Light (walking / raking leaves)    How many days per week to you exercise? 3    How many minutes per day do you exercise? 30    Total minutes per week of exercise 90      Individualized Goals (developed by patient)   Nutrition Follow meal plan discussed    Physical Activity Exercise 1-2 times per week    Medications take my medication as prescribed    Monitoring  test my blood glucose as discussed      Patient Self-Evaluation of Goals - Patient rates self as meeting previously set goals (% of time)  Nutrition 50 - 75 %    Physical Activity 25 - 50%    Medications >75%    Monitoring Not Applicable    Problem Solving 50 - 75 %    Reducing Risk 25 - 50%    Health Coping 50 - 75 %      Post-Education Assessment   Patient understands the diabetes disease and treatment process. Demonstrates understanding / competency    Patient understands incorporating nutritional management into lifestyle. Demonstrates understanding / competency    Patient undertands incorporating physical activity into lifestyle. Needs Review    Patient understands using medications safely. Demonstrates understanding / competency    Patient understands how to develop strategies to address psychosocial issues. Needs Review    Patient understands how to develop strategies to promote health/change behavior. Needs Review       Outcomes   Expected Outcomes Demonstrated interest in learning. Expect positive outcomes    Future DMSE 2 months    Program Status Not Completed      Subsequent Visit   Since your last visit have you continued or begun to take your medications as prescribed? Yes    Since your last visit have you had your blood pressure checked? No    Since your last visit have you experienced any weight changes? Loss    Weight Loss (lbs) 10    Since your last visit, are you checking your blood glucose at least once a day? N/A           Individualized Plan for Diabetes Self-Management Training:   Learning Objective:  Patient will have a greater understanding of diabetes self-management. Patient education plan is to attend individual and/or group sessions per assessed needs and concerns.   Plan:   Patient Instructions  Begin to increase your walking as the weather improves. Aim to   Work to consistently hit a maximum of 2,000 mg of sodium daily.  Try Ezekiel bread for a great, low sodium bread option.  Try to move the carbs from your snacks to your meals consistently.  Consider buying a cheap blood glucose meter to check your glucose. Check it first thing in the morning before you eat anything.   Expected Outcomes:  Demonstrated interest in learning. Expect positive outcomes  Education material provided: Snack sheet  If problems or questions, patient to contact team via:  Phone and Email  Future DSME appointment: 2 months

## 2021-02-01 DIAGNOSIS — R202 Paresthesia of skin: Secondary | ICD-10-CM | POA: Diagnosis not present

## 2021-02-01 DIAGNOSIS — M25542 Pain in joints of left hand: Secondary | ICD-10-CM | POA: Diagnosis not present

## 2021-02-01 DIAGNOSIS — M25541 Pain in joints of right hand: Secondary | ICD-10-CM | POA: Diagnosis not present

## 2021-02-04 DIAGNOSIS — D1801 Hemangioma of skin and subcutaneous tissue: Secondary | ICD-10-CM | POA: Diagnosis not present

## 2021-02-04 DIAGNOSIS — L905 Scar conditions and fibrosis of skin: Secondary | ICD-10-CM | POA: Diagnosis not present

## 2021-02-04 DIAGNOSIS — Z85828 Personal history of other malignant neoplasm of skin: Secondary | ICD-10-CM | POA: Diagnosis not present

## 2021-02-04 DIAGNOSIS — L918 Other hypertrophic disorders of the skin: Secondary | ICD-10-CM | POA: Diagnosis not present

## 2021-02-04 DIAGNOSIS — D225 Melanocytic nevi of trunk: Secondary | ICD-10-CM | POA: Diagnosis not present

## 2021-02-04 DIAGNOSIS — L821 Other seborrheic keratosis: Secondary | ICD-10-CM | POA: Diagnosis not present

## 2021-02-04 DIAGNOSIS — L578 Other skin changes due to chronic exposure to nonionizing radiation: Secondary | ICD-10-CM | POA: Diagnosis not present

## 2021-02-04 DIAGNOSIS — L570 Actinic keratosis: Secondary | ICD-10-CM | POA: Diagnosis not present

## 2021-02-04 DIAGNOSIS — L720 Epidermal cyst: Secondary | ICD-10-CM | POA: Diagnosis not present

## 2021-02-16 DIAGNOSIS — G5603 Carpal tunnel syndrome, bilateral upper limbs: Secondary | ICD-10-CM | POA: Diagnosis not present

## 2021-02-16 DIAGNOSIS — G629 Polyneuropathy, unspecified: Secondary | ICD-10-CM | POA: Diagnosis not present

## 2021-02-18 DIAGNOSIS — M25571 Pain in right ankle and joints of right foot: Secondary | ICD-10-CM | POA: Diagnosis not present

## 2021-03-16 ENCOUNTER — Other Ambulatory Visit: Payer: Self-pay

## 2021-03-16 ENCOUNTER — Encounter: Payer: Medicare Other | Attending: Geriatric Medicine | Admitting: Dietician

## 2021-03-16 ENCOUNTER — Encounter: Payer: Self-pay | Admitting: Dietician

## 2021-03-16 VITALS — Ht 72.0 in | Wt 282.7 lb

## 2021-03-16 DIAGNOSIS — E119 Type 2 diabetes mellitus without complications: Secondary | ICD-10-CM | POA: Insufficient documentation

## 2021-03-16 NOTE — Progress Notes (Signed)
Diabetes Self-Management Education  Visit Type: Follow-up  Appt. Start Time: 0930 Appt. End Time: 1010  03/16/2021  Joshua Raymond, identified by name and date of birth, is a 65 y.o. male with a diagnosis of Diabetes:  .   ASSESSMENT  Pt reports plateauing with their weight loss, fluctuating between 270-275 at home. Pt reports they might slip off their diet if they weren't being held accountable by entering their nutrition into their Fooducate app. Pt reports eating the same thing frequently and getting bored with it occasionally. Pt wants to expand their food options to keep from being complacent.  Pt sodium intake is still high, attributes it to their bread consumption, packaged foods, and cheese consumption. Pt eating 2,700-3,200 mg per day. Pt is eating around 180g of carbs daily. Usually has baked fish for dinner. Pt has not addressed CPAP issues yet. Pt states wife told them they are not snoring as bad anymore.  Dietitian gave pt a sample glucose meter and instruction as to how to check blood sugar during visit. BG during visit - 110 mg/dL One Touch Verio Flex  Lot #: [id number] X S/N: [id number] Exp Date: 08/27/2021 Height 6' (1.829 m), weight 282 lb 11.2 oz (128.2 kg). Body mass index is 38.34 kg/m.   Diabetes Self-Management Education - 03/16/21 0900      Visit Information   Visit Type Follow-up      Pre-Education Assessment   Patient understands monitoring blood glucose, interpreting and using results Needs Instruction      Complications   How often do you check your blood sugar? 1-2 times/day   RD provided meter   Fasting Blood glucose range (mg/dL) 70-129      Dietary Intake   Breakfast 1 egg, 1 strip of bacon, cup of mac and cheese, diet coke    Snack (morning) Rice cake, diet lemonade    Lunch Chicken breast, green beans, carrots, 1/2 cup roasted potatoes    Dinner Chicken breast, bagel, 1 cup whole milk    Snack (evening) almonds    Beverage(s) diet soda, diet  lemonade, milk      Exercise   Exercise Type Light (walking / raking leaves);ADL's    How many days per week to you exercise? 5    How many minutes per day do you exercise? 30    Total minutes per week of exercise 150      Individualized Goals (developed by patient)   Physical Activity Exercise 3-5 times per week   Increase intensity   Monitoring  test my blood glucose as discussed   Fasting daily     Patient Self-Evaluation of Goals - Patient rates self as meeting previously set goals (% of time)   Nutrition >75%    Physical Activity 25 - 50%    Medications >75%    Monitoring Not Applicable    Problem Solving 25 - 50%    Reducing Risk 50 - 75 %    Health Coping >75%      Outcomes   Expected Outcomes Demonstrated interest in learning. Expect positive outcomes    Future DMSE 3-4 months    Program Status Not Completed      Subsequent Visit   Since your last visit have you continued or begun to take your medications as prescribed? Yes    Since your last visit have you experienced any weight changes? Loss    Weight Loss (lbs) 1    Since your last visit, are you checking your blood  glucose at least once a day? No   RD provided meter during appointment          Individualized Plan for Diabetes Self-Management Training:   Learning Objective:  Patient will have a greater understanding of diabetes self-management. Patient education plan is to attend individual and/or group sessions per assessed needs and concerns.   Plan:   Patient Instructions  Check your blood sugar every morning before you eat breakfast. Look for numbers between 80-100.  Dispose of your used lancets in a hard plastic container and when full, cap it and tape it shut then throw it in your trash.  Gradually increase the intensity of your walks at your comfort level. Walk with your wife at a quicker pace.  You should be able to talk, but not sing if you are at the right intensity.  Continue to look for a low  sodium bread that you like.   Expected Outcomes:  Demonstrated interest in learning. Expect positive outcomes  If problems or questions, patient to contact team via:  Phone and Email  Future DSME appointment: 3-4 months

## 2021-03-16 NOTE — Patient Instructions (Addendum)
Check your blood sugar every morning before you eat breakfast. Look for numbers between 80-100.  Dispose of your used lancets in a hard plastic container and when full, cap it and tape it shut then throw it in your trash.  Gradually increase the intensity of your walks at your comfort level. Walk with your wife at a quicker pace.  You should be able to talk, but not sing if you are at the right intensity.  Continue to look for a low sodium bread that you like.

## 2021-03-18 DIAGNOSIS — M25571 Pain in right ankle and joints of right foot: Secondary | ICD-10-CM | POA: Diagnosis not present

## 2021-03-18 DIAGNOSIS — G5603 Carpal tunnel syndrome, bilateral upper limbs: Secondary | ICD-10-CM | POA: Diagnosis not present

## 2021-03-31 ENCOUNTER — Other Ambulatory Visit (HOSPITAL_COMMUNITY): Payer: Self-pay | Admitting: Cardiology

## 2021-04-02 ENCOUNTER — Other Ambulatory Visit (HOSPITAL_COMMUNITY): Payer: Self-pay | Admitting: Internal Medicine

## 2021-04-29 DIAGNOSIS — G5600 Carpal tunnel syndrome, unspecified upper limb: Secondary | ICD-10-CM | POA: Insufficient documentation

## 2021-04-29 DIAGNOSIS — Z0000 Encounter for general adult medical examination without abnormal findings: Secondary | ICD-10-CM | POA: Diagnosis not present

## 2021-04-29 DIAGNOSIS — I129 Hypertensive chronic kidney disease with stage 1 through stage 4 chronic kidney disease, or unspecified chronic kidney disease: Secondary | ICD-10-CM | POA: Diagnosis not present

## 2021-04-29 DIAGNOSIS — G5603 Carpal tunnel syndrome, bilateral upper limbs: Secondary | ICD-10-CM | POA: Diagnosis not present

## 2021-04-29 DIAGNOSIS — E1169 Type 2 diabetes mellitus with other specified complication: Secondary | ICD-10-CM | POA: Diagnosis not present

## 2021-04-29 DIAGNOSIS — E7800 Pure hypercholesterolemia, unspecified: Secondary | ICD-10-CM | POA: Diagnosis not present

## 2021-04-29 DIAGNOSIS — Z79899 Other long term (current) drug therapy: Secondary | ICD-10-CM | POA: Diagnosis not present

## 2021-04-29 DIAGNOSIS — N1831 Chronic kidney disease, stage 3a: Secondary | ICD-10-CM | POA: Diagnosis not present

## 2021-04-30 DIAGNOSIS — E119 Type 2 diabetes mellitus without complications: Secondary | ICD-10-CM | POA: Insufficient documentation

## 2021-06-01 ENCOUNTER — Other Ambulatory Visit (HOSPITAL_COMMUNITY): Payer: Self-pay | Admitting: Internal Medicine

## 2021-06-15 ENCOUNTER — Encounter: Payer: Medicare Other | Attending: Geriatric Medicine | Admitting: Dietician

## 2021-06-15 ENCOUNTER — Encounter: Payer: Self-pay | Admitting: Dietician

## 2021-06-15 ENCOUNTER — Other Ambulatory Visit: Payer: Self-pay

## 2021-06-15 VITALS — Ht 72.0 in | Wt 283.4 lb

## 2021-06-15 DIAGNOSIS — E119 Type 2 diabetes mellitus without complications: Secondary | ICD-10-CM

## 2021-06-15 NOTE — Progress Notes (Signed)
Diabetes Self-Management Education  Visit Type: Follow-up  Appt. Start Time: 0845 Appt. End Time: 0920  06/15/2021  [name], identified by name and date of birth, is a 65 y.o. male with a diagnosis of Diabetes:  .   ASSESSMENT  Pt reports a bout of achilles tendonitis that kept them from walking recently. Pt reports having achilles surgery in 2021, and flare ups happens every few years. Pt most recent A1c was 6.3 as of early June, down from original diagnosis value of 6.5. Pt states they were expecting their A1c to be lower. Pt reports checking their BG each morning, usually ~100 mg/dL.  Pt reports frustration with plateau in weight loss. Pt reports eating a high fat diet since beginning to count carbs. Includes lot of cheese, especially with sandwiches, and snacks on multiple handfuls of almonds.  Pt still logs food on Fooducate app, and reports a daily total of fat grams of 79 g yesterday.  Height 6' (1.829 m), weight 283 lb 6.4 oz (128.5 kg). Body mass index is 38.44 kg/m.   Diabetes Self-Management Education - 06/15/21 0847       Visit Information   Visit Type Follow-up      Complications   Last HgB A1C per patient/outside source 6.3 %   Early June   How often do you check your blood sugar? 1-2 times/day    Fasting Blood glucose range (mg/dL) 70-129      Dietary Intake   Breakfast 3 slices [place] bacon, 3 slices ham, 2 slices cheddar cheese    Lunch Lasagna, Coke Zero    [employer] (afternoon) [employer]    Dinner Chicken breast, 1 slice [place] bacon, mayo, 1 oz cheddar cheese, diet tea    Beverage(s) coke zero, diet tea, water      Exercise   Exercise Type ADL's   Pt currently dealing with achilles tendonitis.     Patient Self-Evaluation of Goals - Patient rates self as meeting previously set goals (% of time)   Nutrition 25 - 50%    Physical Activity < 25%    Medications >75%    Monitoring >75%    Problem Solving 25 - 50%    Reducing Risk 25 - 50%     Health Coping 50 - 75 %      Post-Education Assessment   Patient understands the diabetes disease and treatment process. Demonstrates understanding / competency    Patient understands incorporating nutritional management into lifestyle. Needs Review    Patient undertands incorporating physical activity into lifestyle. Needs Review    Patient understands using medications safely. Demonstrates understanding / competency    Patient understands monitoring blood glucose, interpreting and using results Demonstrates understanding / competency    Patient understands prevention, detection, and treatment of acute complications. Needs Review    Patient understands prevention, detection, and treatment of chronic complications. Needs Review   Cardiovascular complications   Patient understands how to develop strategies to address psychosocial issues. Demonstrates understanding / competency    Patient understands how to develop strategies to promote health/change behavior. Needs Review      Outcomes   Expected Outcomes Demonstrated interest in learning. Expect positive outcomes    Future DMSE 6 months    Program Status Not Completed      Subsequent Visit   Since your last visit have you continued or begun to take your medications as prescribed? Yes    Since your last visit have you had your blood pressure checked?  No    Since your last visit have you experienced any weight changes? Gain    Weight Gain (lbs) 2    Since your last visit, are you checking your blood glucose at least once a day? Yes             Individualized Plan for Diabetes Self-Management Training:   Learning Objective:  Patient will have a greater understanding of diabetes self-management. Patient education plan is to attend individual and/or group sessions per assessed needs and concerns.   Plan:   Patient Instructions  Choose low-fat mayonnaise, or miracle whip for your sandwiches.   Choose thin sliced cheddar cheese to  reduce your fat intake a little.  Aim to eat no more than 55g of fat per day.  Continue to log your food on your Fooducate app to see how much fat you are eating in a day. Begin to read your food labels for fat grams.  When you snack on almonds, measure 1/4 cup and eat them one at a time. Use your "Saturated Fats Worksheet" to help reduce your saturated fat intake.  Check your blood sugar 2 hours after you start eating a meal  Work on slowing down while eating meals. Put your utensils down between bites. Chew each bite completely before taking another one.  Expected Outcomes:  Demonstrated interest in learning. Expect positive outcomes  Education material provided: Saturated Fat Worksheet  If problems or questions, patient to contact team via:  Phone and Email  Future DSME appointment: 6 months

## 2021-06-15 NOTE — Patient Instructions (Addendum)
Choose low-fat mayonnaise, or miracle whip for your sandwiches.   Choose thin sliced cheddar cheese to reduce your fat intake a little.  Aim to eat no more than 55g of fat per day.  Continue to log your food on your Fooducate app to see how much fat you are eating in a day. Begin to read your food labels for fat grams.  When you snack on almonds, measure 1/4 cup and eat them one at a time. Use your "Saturated Fats Worksheet" to help reduce your saturated fat intake.  Check your blood sugar 2 hours after you start eating a meal  Work on slowing down while eating meals. Put your utensils down between bites. Chew each bite completely before taking another one.

## 2021-06-16 DIAGNOSIS — E119 Type 2 diabetes mellitus without complications: Secondary | ICD-10-CM | POA: Diagnosis not present

## 2021-06-16 DIAGNOSIS — G5603 Carpal tunnel syndrome, bilateral upper limbs: Secondary | ICD-10-CM | POA: Diagnosis not present

## 2021-06-28 ENCOUNTER — Other Ambulatory Visit (HOSPITAL_COMMUNITY): Payer: Self-pay | Admitting: Internal Medicine

## 2021-06-29 ENCOUNTER — Other Ambulatory Visit (HOSPITAL_COMMUNITY): Payer: Self-pay | Admitting: Cardiology

## 2021-07-06 MED ORDER — ENTRESTO 97-103 MG PO TABS: 1.0000 | ORAL_TABLET | Freq: Two times a day (BID) | ORAL | 0 refills | Status: DC

## 2021-07-29 DIAGNOSIS — L03113 Cellulitis of right upper limb: Secondary | ICD-10-CM | POA: Diagnosis not present

## 2021-07-29 DIAGNOSIS — G5603 Carpal tunnel syndrome, bilateral upper limbs: Secondary | ICD-10-CM | POA: Diagnosis not present

## 2021-08-16 DIAGNOSIS — G5602 Carpal tunnel syndrome, left upper limb: Secondary | ICD-10-CM | POA: Diagnosis not present

## 2021-08-16 DIAGNOSIS — L03113 Cellulitis of right upper limb: Secondary | ICD-10-CM | POA: Diagnosis not present

## 2021-08-16 DIAGNOSIS — G5601 Carpal tunnel syndrome, right upper limb: Secondary | ICD-10-CM | POA: Diagnosis not present

## 2021-08-24 DIAGNOSIS — M659 Synovitis and tenosynovitis, unspecified: Secondary | ICD-10-CM | POA: Diagnosis not present

## 2021-08-24 DIAGNOSIS — M7542 Impingement syndrome of left shoulder: Secondary | ICD-10-CM | POA: Diagnosis not present

## 2021-08-24 DIAGNOSIS — M7552 Bursitis of left shoulder: Secondary | ICD-10-CM | POA: Diagnosis not present

## 2021-08-24 DIAGNOSIS — M19012 Primary osteoarthritis, left shoulder: Secondary | ICD-10-CM | POA: Diagnosis not present

## 2021-08-24 DIAGNOSIS — M24112 Other articular cartilage disorders, left shoulder: Secondary | ICD-10-CM | POA: Diagnosis not present

## 2021-08-24 DIAGNOSIS — G8918 Other acute postprocedural pain: Secondary | ICD-10-CM | POA: Diagnosis not present

## 2021-08-24 DIAGNOSIS — M948X1 Other specified disorders of cartilage, shoulder: Secondary | ICD-10-CM | POA: Diagnosis not present

## 2021-08-24 DIAGNOSIS — M94212 Chondromalacia, left shoulder: Secondary | ICD-10-CM | POA: Diagnosis not present

## 2021-08-24 DIAGNOSIS — G5601 Carpal tunnel syndrome, right upper limb: Secondary | ICD-10-CM | POA: Diagnosis not present

## 2021-09-07 DIAGNOSIS — M25641 Stiffness of right hand, not elsewhere classified: Secondary | ICD-10-CM | POA: Diagnosis not present

## 2021-09-08 ENCOUNTER — Ambulatory Visit: Payer: Medicare Other | Admitting: Cardiology

## 2021-09-08 ENCOUNTER — Other Ambulatory Visit: Payer: Self-pay

## 2021-09-08 ENCOUNTER — Encounter: Payer: Self-pay | Admitting: Cardiology

## 2021-09-08 VITALS — BP 100/60 | HR 78 | Ht 72.0 in | Wt 281.0 lb

## 2021-09-08 DIAGNOSIS — G4733 Obstructive sleep apnea (adult) (pediatric): Secondary | ICD-10-CM | POA: Diagnosis not present

## 2021-09-08 NOTE — Patient Instructions (Addendum)
Medication Instructions:  Your physician recommends that you continue on your current medications as directed. Please refer to the Current Medication list given to you today.  *If you need a refill on your cardiac medications before your next appointment, please call your pharmacy*   Follow-Up: At [facility], you and your health needs are our priority.  As part of our continuing mission to provide you with exceptional heart care, we have created designated Provider Care Teams.  These Care Teams include your primary Cardiologist (physician) and Advanced Practice Providers (APPs -  Physician Assistants and Nurse Practitioners) who all work together to provide you with the care you need, when you need it.  Your next appointment:   6 months  The format for your next appointment:   In Person  Provider:   Fransico Him,  MD   Other Instructions You have been referred to the [facility]. They will be in touch with you to schedule an appointment.

## 2021-09-08 NOTE — Progress Notes (Signed)
Date:  09/08/2021   ID:  Joshua Raymond, DOB 05-27-1956, MRN [id number]  PCP:  Lajean Manes, MD  Cardiologist:  Glori Bickers, MD Electrophysiologist:  None   Chief Complaint:  OSA  History of Present Illness:    Joshua Raymond is a 65 y.o. male  with a hx of chronic systolic CHF, OSA not on CPAP, CKD stage 3 and fatigue.  He was referred by [facility] clinic to establish care with a sleep practitioner and get back on CPAP.  He was diagnosed with OSA over 20 years ago and has had 2 sleep studies with the last being in 2019 and he was placed back on CPAP after being off of it.    At last OV he complained of waking up at night with the pressure feeling too high so I decreased PAP to 13cm H2O.  He has to sleep in a recliner to sleep because the tossing and turning dislodges his mask.    Since I saw him last he has stopped using his device.  He is very frustrated with his masks.  He has tried every type of mask but the facial hair prevents a good seal and the mask moves in his sleep and he does not sleep well. He does not want to shave his beard.   Prior CV studies:   The following studies were reviewed today:  PAP compliance download  Past Medical History:  Diagnosis Date   CHF (congestive heart failure) (HCC)    Complication of anesthesia    GERD (gastroesophageal reflux disease)    Hypertension    PONV (postoperative nausea and vomiting)    Past Surgical History:  Procedure Laterality Date   bone spur surgery     CERVICAL DISCECTOMY N/A 1995   C5C6   COLONOSCOPY WITH PROPOFOL N/A 11/08/2016   Procedure: COLONOSCOPY WITH PROPOFOL;  Surgeon: Garlan Fair, MD;  Location: [facility];  Service: Endoscopy;  Laterality: N/A;   CORONARY ANGIOPLASTY     ESOPHAGOGASTRODUODENOSCOPY (EGD) WITH PROPOFOL N/A 11/08/2016   Procedure: ESOPHAGOGASTRODUODENOSCOPY (EGD) WITH PROPOFOL;  Surgeon: Garlan Fair, MD;  Location: [facility];  Service: Endoscopy;  Laterality: N/A;   KNEE  ARTHROSCOPY Right 1973   KNEE SURGERY     LAMINECTOMY N/A 2002   LAMINECTOMY     RIGHT/LEFT HEART CATH AND CORONARY ANGIOGRAPHY N/A 03/24/2017   Procedure: Right/Left Heart Cath and Coronary Angiography;  Surgeon: Belva Crome, MD;  Location: [facility];  Service: Cardiovascular;  Laterality: N/A;   ROTATOR CUFF REPAIR     SHOULDER ARTHROSCOPY W/ ROTATOR CUFF REPAIR Right 2000   TENDON LENGTHENING Left 2013   Achilles tendon extension     No outpatient medications have been marked as taking for the 09/08/21 encounter (Office Visit) with Sueanne Margarita, MD.     Allergies:   Other   Social History   Tobacco Use   Smoking status: Former   Smokeless tobacco: Never   Tobacco comments:    stopped 34 years ago  Substance Use Topics   Alcohol use: Yes    Comment: moderation 1 drink a day during the week   Drug use: No     Family Hx: The patient's family history includes Heart attack in his father; Ovarian cancer in his mother.  ROS:   Please see the history of present illness.     All other systems reviewed and are negative.   Labs/Other Tests and Data Reviewed:  Recent Labs: No results found for requested labs within last 8760 hours.   Recent Lipid Panel Lab Results  Component Value Date/Time   CHOL 117 03/23/2017 05:18 AM   TRIG 62 03/23/2017 05:18 AM   HDL 33 (L) 03/23/2017 05:18 AM   CHOLHDL 3.5 03/23/2017 05:18 AM   LDLCALC 72 03/23/2017 05:18 AM    Wt Readings from Last 3 Encounters:  06/15/21 283 lb 6.4 oz (128.5 kg)  03/16/21 282 lb 11.2 oz (128.2 kg)  01/19/21 284 lb 8 oz (129 kg)     Objective:    Vital Signs:  Ht 6' (1.829 m)   BMI 38.44 kg/m   GEN: Well nourished, well developed in no acute distress HEENT: Normal NECK: No JVD; No carotid bruits LYMPHATICS: No lymphadenopathy CARDIAC:RRR, no murmurs, rubs, gallops RESPIRATORY:  Clear to auscultation without rales, wheezing or rhonchi  ABDOMEN: Soft, non-tender,  non-distended MUSCULOSKELETAL:  No edema; No deformity  SKIN: Warm and dry NEUROLOGIC:  Alert and oriented x 3 PSYCHIATRIC:  Normal affect    ASSESSMENT & PLAN:    1.  OSA  -unfortunately he is intolerant to the masks and has tried all of them -the masks do not seal well due to facial hair and he does not want to shave his beard -we discussed with hypoglossal nerve stimulator but his BMI is too high (needs to be 32 or lower and he is at 38) -I have recommended that he get into a good  exercise program and try lose enough weight to get BMI to 32  2.  Morbid Obesity -I have encouraged him to cut back on carbs and portions.  -encouraged increased aerobic exercise -will refer to [facility]  Followup with me in 6 months   Medication Adjustments/Labs and Tests Ordered: Current medicines are reviewed at length with the patient today.  Concerns regarding medicines are outlined above.  Tests Ordered: Orders Placed This Encounter  Procedures   EKG 12-Lead    Medication Changes: No orders of the defined types were placed in this encounter.   Disposition:  Follow up in 1 year(s)  Signed, Fransico Him, MD  09/08/2021 8:09 AM    [facility]

## 2021-09-24 ENCOUNTER — Other Ambulatory Visit: Payer: Self-pay | Admitting: Cardiology

## 2021-09-30 ENCOUNTER — Other Ambulatory Visit (HOSPITAL_COMMUNITY): Payer: Self-pay | Admitting: Cardiology

## 2021-10-29 DIAGNOSIS — I129 Hypertensive chronic kidney disease with stage 1 through stage 4 chronic kidney disease, or unspecified chronic kidney disease: Secondary | ICD-10-CM | POA: Diagnosis not present

## 2021-10-29 DIAGNOSIS — E1169 Type 2 diabetes mellitus with other specified complication: Secondary | ICD-10-CM | POA: Diagnosis not present

## 2021-10-29 DIAGNOSIS — N1831 Chronic kidney disease, stage 3a: Secondary | ICD-10-CM | POA: Diagnosis not present

## 2021-12-02 ENCOUNTER — Encounter: Payer: Medicare Other | Attending: Geriatric Medicine | Admitting: Dietician

## 2021-12-02 ENCOUNTER — Encounter: Payer: Self-pay | Admitting: Dietician

## 2021-12-02 ENCOUNTER — Other Ambulatory Visit: Payer: Self-pay

## 2021-12-02 VITALS — Ht 72.0 in | Wt 283.5 lb

## 2021-12-02 DIAGNOSIS — E119 Type 2 diabetes mellitus without complications: Secondary | ICD-10-CM | POA: Insufficient documentation

## 2021-12-02 NOTE — Progress Notes (Signed)
Diabetes Self-Management Education  Visit Type: Follow-up  Appt. Start Time: 1400 Appt. End Time: 1435  12/02/2021  [name], identified by name and date of birth, is a 66 y.o. male with a diagnosis of Diabetes:  .   ASSESSMENT Pt A1c is down to 6.1 from 6.3 (05/2021), original A1c of 6.5 (10/2020) Pt reports restarting going to the gym this week, plans on going 3-4 times a week. Pt reports difficulty sticking to their diet over the holidays, but has gotten back on track since the New Year. Pt has begun logging foods again, but is still consuming too many fat grams a day. Pt has begun drinking black coffee a couple times during the day, pt states it helps to curb their appetite. Pt reports trying to get their sodium intake back down as well, pt feels that they are retaining a little more fluid than normal (puffiness in legs/ankles). Pt is not taking their blood pressure at home, but reports it is usually in range. Pt reports a 90 day FBG avg of 108 mg/dL. Pt has had one surgery for carpal tunnel, and will be having another in the near future. Pt has a goal of weight loss to be eligible for OSA surgery.  Height 6' (1.829 m), weight 283 lb 8 oz (128.6 kg). Body mass index is 38.45 kg/m.   Diabetes Self-Management Education - 12/02/21 1403       Visit Information   Visit Type Follow-up      Complications   Last HgB A1C per patient/outside source 6.1 %   11/08/2021   How often do you check your blood sugar? 1-2 times/day    Fasting Blood glucose range (mg/dL) 70-129      Dietary Intake   Breakfast 1 whole wheat bagel, 2 eggs    Snack (morning) cheese and crackers    Lunch 1 cup white rice, 1 cup stir-fry veggies    Snack (afternoon) peanuts, almonds    Dinner Large salad w/cabbage, spinach, broccoli, tomatoes, canned tuna, balsamic vinegraette    Snack (evening) handful of nuts    Beverage(s) black coffee,      Exercise   Exercise Type ADL's;Moderate (swimming / aerobic  walking)    How many days per week to you exercise? 4    How many minutes per day do you exercise? 30    Total minutes per week of exercise 120      Individualized Goals (developed by patient)   Nutrition Follow meal plan discussed   Monitor fat intake, keep daily consumption below 55g   Physical Activity Exercise 3-5 times per week      Patient Self-Evaluation of Goals - Patient rates self as meeting previously set goals (% of time)   Nutrition 25 - 50%    Physical Activity 50 - 75 %    Medications >75%    Monitoring >75%    Problem Solving 25 - 50%    Reducing Risk < 25%    Health Coping 25 - 50%      Outcomes   Expected Outcomes Demonstrated interest in learning. Expect positive outcomes    Future DMSE 2 months    Program Status Not Completed      Subsequent Visit   Since your last visit have you continued or begun to take your medications as prescribed? Yes    Since your last visit have you experienced any weight changes? Gain    Weight Gain (lbs) 2    Since your  last visit, are you checking your blood glucose at least once a day? Yes             Individualized Plan for Diabetes Self-Management Training:   Learning Objective:  Patient will have a greater understanding of diabetes self-management. Patient education plan is to attend individual and/or group sessions per assessed needs and concerns.   Plan:   Patient Instructions  Continue to work on lowering your daily fat consumption! Remember to use your three strategies to reduce fat consumption. Use your "Fats" handout to identify your common sources of fat and find the appropriate serving sizes for these foods.  When having eggs, only have 1 yolk. Consider trying "Bagel Thins" for breakfast as well.  When snacking on almonds or peanuts, pair them with baby carrots or an occasional apple. Continue to work on slowing down when snacking, eat one bite at a time.  Look into "[employer]" brand flatbread crackers to have  with your tuna, or snacks.  Expected Outcomes:  Demonstrated interest in learning. Expect positive outcomes  Education material provided: "Fats" list  If problems or questions, patient to contact team via:  Phone and Email  Future DSME appointment: 2 months

## 2021-12-02 NOTE — Patient Instructions (Addendum)
Continue to work on lowering your daily fat consumption! Remember to use your three strategies to reduce fat consumption. Use your "Fats" handout to identify your common sources of fat and find the appropriate serving sizes for these foods.  When having eggs, only have 1 yolk. Consider trying "Bagel Thins" for breakfast as well.  When snacking on almonds or peanuts, pair them with baby carrots or an occasional apple. Continue to work on slowing down when snacking, eat one bite at a time.  Look into "[employer]" brand flatbread crackers to have with your tuna, or snacks.

## 2021-12-17 DIAGNOSIS — G5602 Carpal tunnel syndrome, left upper limb: Secondary | ICD-10-CM | POA: Diagnosis not present

## 2021-12-25 ENCOUNTER — Other Ambulatory Visit: Payer: Self-pay | Admitting: Cardiology

## 2021-12-28 ENCOUNTER — Other Ambulatory Visit (HOSPITAL_COMMUNITY): Payer: Self-pay | Admitting: Cardiology

## 2021-12-31 DIAGNOSIS — M25641 Stiffness of right hand, not elsewhere classified: Secondary | ICD-10-CM | POA: Diagnosis not present

## 2022-01-31 ENCOUNTER — Other Ambulatory Visit: Payer: Self-pay

## 2022-01-31 ENCOUNTER — Encounter: Payer: Medicare Other | Attending: Geriatric Medicine | Admitting: Dietician

## 2022-01-31 ENCOUNTER — Encounter: Payer: Self-pay | Admitting: Dietician

## 2022-01-31 VITALS — Ht 72.0 in | Wt 285.1 lb

## 2022-01-31 DIAGNOSIS — E119 Type 2 diabetes mellitus without complications: Secondary | ICD-10-CM | POA: Insufficient documentation

## 2022-01-31 NOTE — Patient Instructions (Addendum)
Pay attention to your FBG in the morning after taking your psyllium in the morning versus taking it in the evening. If you notice your fasting blood glucose is higher when taking psyllium in the evening, move it to the morning. ? ?Check your blood sugar each morning before eating or drinking (fasting). ?Look for numbers under 125 mg/dL ?Check your blood sugar 2 hours after you begin eating a meal. ?Look for numbers under 180 mg/dL at all times. ? ?Work on increasing the volume in your meals with nutrient dense, high fiber foods.  ? ?Add in some fruit or vegetables with your cheese and nuts for a snack. ? ? ? ?

## 2022-01-31 NOTE — Progress Notes (Signed)
Diabetes Self-Management Education ? ?Visit Type: Follow-up ? ?Appt. Start Time: 1400 Appt. End Time: 1440 ? ?01/31/2022 ? ?[name] Parents, identified by name and date of birth, is a 66 y.o. male with a diagnosis of Diabetes:  .  ? ?ASSESSMENT ?Pt reports checking BG more than once a day now, fasting, before some meals, and 2 hours after eating a meal. Pt reports FBG is higher than their CBG as the day progresses. Pt reports FBG of ~120-140 recently, and CBG of low-100s after meals. ?Pt reports not being as motivated to lose weight for OSA surgery, pt reports that they are accepting that they may never lose weight.  ?Pt has been going to the gym a couple times a week, riding stationary bike and hand bike about 30 minutes. Pt will be more active doing yard work as the weather improves. ?Pt is trying to eat no more than 2,200 kcal a day, still tracking food on Fooducate app. Pt is consuming low volumes of energy dense foods to meat their calorie goals. Pt reports snacking on nuts and cheese throughout the day. Pt reports beginning to take psyllium husk for the last 2 months to help curb their late night appetite. Fasting numbers have been more erratic since. ?Height 6' (1.829 m), weight 285 lb 1.6 oz (129.3 kg). ?Body mass index is 38.67 kg/m?. ? ? Diabetes Self-Management Education - 01/31/22 1400   ? ?  ? Visit Information  ? Visit Type Follow-up   ?  ? Complications  ? How often do you check your blood sugar? 1-2 times/day   ? Fasting Blood glucose range (mg/dL) 130-179   ? Postprandial Blood glucose range (mg/dL) 70-129   ?  ? Dietary Intake  ? Breakfast 2 eggs, 2 slices lean ham, 1 piece cheddar, diet tea   ? Lunch Large salad, chcicken breast, salsa, glass of sweet tea, margarita, handful of torilla chips   ? Dinner Tilapia, hot sauce   ? Beverage(s) diet tea, sweet tea, margarita   ?  ? Exercise  ? Exercise Type ADL's;Moderate (swimming / aerobic walking)   ? How many days per week to you exercise? 2   ? How  many minutes per day do you exercise? 30   ? Total minutes per week of exercise 60   ?  ? Individualized Goals (developed by patient)  ? Nutrition Follow meal plan discussed   More nutrient dense foods.  ? Physical Activity Exercise 3-5 times per week   ?  ? Patient Self-Evaluation of Goals - Patient rates self as meeting previously set goals (% of time)  ? Nutrition 25 - 50%   ? Physical Activity 25 - 50%   ? Medications >75%   ? Monitoring >75%   ? Problem Solving 25 - 50%   Doing internet research  ? Reducing Risk < 25%   ? Health Coping 25 - 50%   ?  ? Outcomes  ? Expected Outcomes Demonstrated interest in learning. Expect positive outcomes   ? Future DMSE 2 months   ? Program Status Not Completed   ?  ? Subsequent Visit  ? Since your last visit have you continued or begun to take your medications as prescribed? Yes   ? Since your last visit have you experienced any weight changes? Gain   ? Weight Gain (lbs) 3   ? Since your last visit, are you checking your blood glucose at least once a day? Yes   Multiple times now  ? ?  ?  ? ?  ? ? ?  Individualized Plan for Diabetes Self-Management Training:  ? ?Learning Objective:  Patient will have a greater understanding of diabetes self-management. ?Patient education plan is to attend individual and/or group sessions per assessed needs and concerns. ?  ?Plan:  ? ?Patient Instructions  ?Pay attention to your FBG in the morning after taking your psyllium in the morning versus taking it in the evening. If you notice your fasting blood glucose is higher when taking psyllium in the evening, move it to the morning. ? ?Check your blood sugar each morning before eating or drinking (fasting). ?Look for numbers under 125 mg/dL ?Check your blood sugar 2 hours after you begin eating a meal. ?Look for numbers under 180 mg/dL at all times. ? ?Work on increasing the volume in your meals with nutrient dense, high fiber foods.  ? ?Add in some fruit or vegetables with your cheese and nuts  for a snack. ? ? ? ? ?Expected Outcomes:  Demonstrated interest in learning. Expect positive outcomes ? ?Education material provided: Proofreader ? ?If problems or questions, patient to contact team via:  Phone and Email ? ?Future DSME appointment: 2 months ?

## 2022-02-08 DIAGNOSIS — L578 Other skin changes due to chronic exposure to nonionizing radiation: Secondary | ICD-10-CM | POA: Diagnosis not present

## 2022-02-08 DIAGNOSIS — L918 Other hypertrophic disorders of the skin: Secondary | ICD-10-CM | POA: Diagnosis not present

## 2022-02-08 DIAGNOSIS — L821 Other seborrheic keratosis: Secondary | ICD-10-CM | POA: Diagnosis not present

## 2022-02-08 DIAGNOSIS — Z85828 Personal history of other malignant neoplasm of skin: Secondary | ICD-10-CM | POA: Diagnosis not present

## 2022-02-08 DIAGNOSIS — D485 Neoplasm of uncertain behavior of skin: Secondary | ICD-10-CM | POA: Diagnosis not present

## 2022-02-08 DIAGNOSIS — L570 Actinic keratosis: Secondary | ICD-10-CM | POA: Diagnosis not present

## 2022-02-08 DIAGNOSIS — D044 Carcinoma in situ of skin of scalp and neck: Secondary | ICD-10-CM | POA: Diagnosis not present

## 2022-04-04 ENCOUNTER — Ambulatory Visit: Payer: Medicare Other | Admitting: Dietician

## 2022-05-06 DIAGNOSIS — E7800 Pure hypercholesterolemia, unspecified: Secondary | ICD-10-CM | POA: Diagnosis not present

## 2022-05-06 DIAGNOSIS — Z0000 Encounter for general adult medical examination without abnormal findings: Secondary | ICD-10-CM | POA: Diagnosis not present

## 2022-05-06 DIAGNOSIS — Z79899 Other long term (current) drug therapy: Secondary | ICD-10-CM | POA: Diagnosis not present

## 2022-05-06 DIAGNOSIS — E1169 Type 2 diabetes mellitus with other specified complication: Secondary | ICD-10-CM | POA: Diagnosis not present

## 2022-05-06 DIAGNOSIS — Z23 Encounter for immunization: Secondary | ICD-10-CM | POA: Diagnosis not present

## 2022-05-06 DIAGNOSIS — I129 Hypertensive chronic kidney disease with stage 1 through stage 4 chronic kidney disease, or unspecified chronic kidney disease: Secondary | ICD-10-CM | POA: Diagnosis not present

## 2022-05-06 DIAGNOSIS — N1831 Chronic kidney disease, stage 3a: Secondary | ICD-10-CM | POA: Diagnosis not present

## 2022-05-16 DIAGNOSIS — R5383 Other fatigue: Secondary | ICD-10-CM | POA: Diagnosis not present

## 2022-05-16 DIAGNOSIS — I10 Essential (primary) hypertension: Secondary | ICD-10-CM | POA: Diagnosis not present

## 2022-05-16 DIAGNOSIS — E7849 Other hyperlipidemia: Secondary | ICD-10-CM | POA: Diagnosis not present

## 2022-05-16 DIAGNOSIS — N183 Chronic kidney disease, stage 3 unspecified: Secondary | ICD-10-CM | POA: Diagnosis not present

## 2022-05-16 DIAGNOSIS — R0602 Shortness of breath: Secondary | ICD-10-CM | POA: Diagnosis not present

## 2022-05-16 DIAGNOSIS — E118 Type 2 diabetes mellitus with unspecified complications: Secondary | ICD-10-CM | POA: Diagnosis not present

## 2022-05-16 DIAGNOSIS — G4733 Obstructive sleep apnea (adult) (pediatric): Secondary | ICD-10-CM | POA: Diagnosis not present

## 2022-05-30 DIAGNOSIS — N1831 Chronic kidney disease, stage 3a: Secondary | ICD-10-CM | POA: Diagnosis not present

## 2022-05-30 DIAGNOSIS — G4733 Obstructive sleep apnea (adult) (pediatric): Secondary | ICD-10-CM | POA: Diagnosis not present

## 2022-05-30 DIAGNOSIS — E118 Type 2 diabetes mellitus with unspecified complications: Secondary | ICD-10-CM | POA: Diagnosis not present

## 2022-05-30 DIAGNOSIS — I129 Hypertensive chronic kidney disease with stage 1 through stage 4 chronic kidney disease, or unspecified chronic kidney disease: Secondary | ICD-10-CM | POA: Diagnosis not present

## 2022-06-13 DIAGNOSIS — E118 Type 2 diabetes mellitus with unspecified complications: Secondary | ICD-10-CM | POA: Diagnosis not present

## 2022-06-13 DIAGNOSIS — N1831 Chronic kidney disease, stage 3a: Secondary | ICD-10-CM | POA: Diagnosis not present

## 2022-06-13 DIAGNOSIS — G4733 Obstructive sleep apnea (adult) (pediatric): Secondary | ICD-10-CM | POA: Diagnosis not present

## 2022-06-13 DIAGNOSIS — I129 Hypertensive chronic kidney disease with stage 1 through stage 4 chronic kidney disease, or unspecified chronic kidney disease: Secondary | ICD-10-CM | POA: Diagnosis not present

## 2022-06-17 DIAGNOSIS — L720 Epidermal cyst: Secondary | ICD-10-CM | POA: Diagnosis not present

## 2022-06-20 DIAGNOSIS — Z09 Encounter for follow-up examination after completed treatment for conditions other than malignant neoplasm: Secondary | ICD-10-CM | POA: Diagnosis not present

## 2022-06-20 DIAGNOSIS — K649 Unspecified hemorrhoids: Secondary | ICD-10-CM | POA: Diagnosis not present

## 2022-06-20 DIAGNOSIS — Z86010 Personal history of colonic polyps: Secondary | ICD-10-CM | POA: Diagnosis not present

## 2022-06-20 DIAGNOSIS — K5730 Diverticulosis of large intestine without perforation or abscess without bleeding: Secondary | ICD-10-CM | POA: Diagnosis not present

## 2022-07-05 DIAGNOSIS — G4733 Obstructive sleep apnea (adult) (pediatric): Secondary | ICD-10-CM | POA: Diagnosis not present

## 2022-07-05 DIAGNOSIS — N1831 Chronic kidney disease, stage 3a: Secondary | ICD-10-CM | POA: Diagnosis not present

## 2022-07-05 DIAGNOSIS — E118 Type 2 diabetes mellitus with unspecified complications: Secondary | ICD-10-CM | POA: Diagnosis not present

## 2022-07-05 DIAGNOSIS — I10 Essential (primary) hypertension: Secondary | ICD-10-CM | POA: Diagnosis not present

## 2022-07-13 DIAGNOSIS — L720 Epidermal cyst: Secondary | ICD-10-CM | POA: Diagnosis not present

## 2022-07-13 DIAGNOSIS — L728 Other follicular cysts of the skin and subcutaneous tissue: Secondary | ICD-10-CM | POA: Diagnosis not present

## 2022-07-27 DIAGNOSIS — N1831 Chronic kidney disease, stage 3a: Secondary | ICD-10-CM | POA: Diagnosis not present

## 2022-07-27 DIAGNOSIS — I10 Essential (primary) hypertension: Secondary | ICD-10-CM | POA: Diagnosis not present

## 2022-07-27 DIAGNOSIS — G4733 Obstructive sleep apnea (adult) (pediatric): Secondary | ICD-10-CM | POA: Diagnosis not present

## 2022-07-27 DIAGNOSIS — E118 Type 2 diabetes mellitus with unspecified complications: Secondary | ICD-10-CM | POA: Diagnosis not present

## 2022-07-31 ENCOUNTER — Encounter: Payer: Self-pay | Admitting: Cardiology

## 2022-08-03 MED ORDER — SPIRONOLACTONE 25 MG PO TABS: 25.0000 mg | ORAL_TABLET | Freq: Every day | ORAL | 1 refills | Status: DC

## 2022-08-09 DIAGNOSIS — L570 Actinic keratosis: Secondary | ICD-10-CM | POA: Diagnosis not present

## 2022-08-09 DIAGNOSIS — L821 Other seborrheic keratosis: Secondary | ICD-10-CM | POA: Diagnosis not present

## 2022-08-09 DIAGNOSIS — Z85828 Personal history of other malignant neoplasm of skin: Secondary | ICD-10-CM | POA: Diagnosis not present

## 2022-08-09 DIAGNOSIS — Z86008 Personal history of in-situ neoplasm of other site: Secondary | ICD-10-CM | POA: Diagnosis not present

## 2022-08-09 DIAGNOSIS — L578 Other skin changes due to chronic exposure to nonionizing radiation: Secondary | ICD-10-CM | POA: Diagnosis not present

## 2022-08-25 DIAGNOSIS — M25561 Pain in right knee: Secondary | ICD-10-CM | POA: Diagnosis not present

## 2022-08-25 DIAGNOSIS — E118 Type 2 diabetes mellitus with unspecified complications: Secondary | ICD-10-CM | POA: Diagnosis not present

## 2022-08-25 DIAGNOSIS — E1169 Type 2 diabetes mellitus with other specified complication: Secondary | ICD-10-CM | POA: Diagnosis not present

## 2022-08-25 DIAGNOSIS — N1831 Chronic kidney disease, stage 3a: Secondary | ICD-10-CM | POA: Diagnosis not present

## 2022-08-25 DIAGNOSIS — M25562 Pain in left knee: Secondary | ICD-10-CM | POA: Diagnosis not present

## 2022-08-25 DIAGNOSIS — I10 Essential (primary) hypertension: Secondary | ICD-10-CM | POA: Diagnosis not present

## 2022-09-21 DIAGNOSIS — M25561 Pain in right knee: Secondary | ICD-10-CM | POA: Diagnosis not present

## 2022-09-21 DIAGNOSIS — M170 Bilateral primary osteoarthritis of knee: Secondary | ICD-10-CM | POA: Diagnosis not present

## 2022-09-22 DIAGNOSIS — M25561 Pain in right knee: Secondary | ICD-10-CM | POA: Diagnosis not present

## 2022-09-22 DIAGNOSIS — I10 Essential (primary) hypertension: Secondary | ICD-10-CM | POA: Diagnosis not present

## 2022-09-22 DIAGNOSIS — E118 Type 2 diabetes mellitus with unspecified complications: Secondary | ICD-10-CM | POA: Diagnosis not present

## 2022-09-22 DIAGNOSIS — M25562 Pain in left knee: Secondary | ICD-10-CM | POA: Diagnosis not present

## 2022-09-22 DIAGNOSIS — N183 Chronic kidney disease, stage 3 unspecified: Secondary | ICD-10-CM | POA: Diagnosis not present

## 2022-09-22 DIAGNOSIS — E1169 Type 2 diabetes mellitus with other specified complication: Secondary | ICD-10-CM | POA: Diagnosis not present

## 2022-09-26 ENCOUNTER — Other Ambulatory Visit (HOSPITAL_COMMUNITY): Payer: Self-pay | Admitting: Cardiology

## 2022-09-30 ENCOUNTER — Encounter: Payer: Self-pay | Admitting: Cardiology

## 2022-10-19 DIAGNOSIS — M25561 Pain in right knee: Secondary | ICD-10-CM | POA: Diagnosis not present

## 2022-10-19 DIAGNOSIS — E1169 Type 2 diabetes mellitus with other specified complication: Secondary | ICD-10-CM | POA: Diagnosis not present

## 2022-10-19 DIAGNOSIS — K5903 Drug induced constipation: Secondary | ICD-10-CM | POA: Diagnosis not present

## 2022-10-19 DIAGNOSIS — M25562 Pain in left knee: Secondary | ICD-10-CM | POA: Diagnosis not present

## 2022-10-19 DIAGNOSIS — I10 Essential (primary) hypertension: Secondary | ICD-10-CM | POA: Diagnosis not present

## 2022-10-19 DIAGNOSIS — N183 Chronic kidney disease, stage 3 unspecified: Secondary | ICD-10-CM | POA: Diagnosis not present

## 2022-10-19 DIAGNOSIS — E65 Localized adiposity: Secondary | ICD-10-CM | POA: Diagnosis not present

## 2022-11-03 DIAGNOSIS — M170 Bilateral primary osteoarthritis of knee: Secondary | ICD-10-CM | POA: Diagnosis not present

## 2022-11-07 DIAGNOSIS — E1169 Type 2 diabetes mellitus with other specified complication: Secondary | ICD-10-CM | POA: Diagnosis not present

## 2022-11-07 DIAGNOSIS — I129 Hypertensive chronic kidney disease with stage 1 through stage 4 chronic kidney disease, or unspecified chronic kidney disease: Secondary | ICD-10-CM | POA: Diagnosis not present

## 2022-11-07 DIAGNOSIS — E7800 Pure hypercholesterolemia, unspecified: Secondary | ICD-10-CM | POA: Diagnosis not present

## 2022-11-09 DIAGNOSIS — N183 Chronic kidney disease, stage 3 unspecified: Secondary | ICD-10-CM | POA: Diagnosis not present

## 2022-11-09 DIAGNOSIS — E7800 Pure hypercholesterolemia, unspecified: Secondary | ICD-10-CM | POA: Diagnosis not present

## 2022-11-09 DIAGNOSIS — E1169 Type 2 diabetes mellitus with other specified complication: Secondary | ICD-10-CM | POA: Diagnosis not present

## 2022-11-09 DIAGNOSIS — Z9189 Other specified personal risk factors, not elsewhere classified: Secondary | ICD-10-CM | POA: Diagnosis not present

## 2022-11-09 DIAGNOSIS — M25569 Pain in unspecified knee: Secondary | ICD-10-CM | POA: Diagnosis not present

## 2022-11-09 DIAGNOSIS — E65 Localized adiposity: Secondary | ICD-10-CM | POA: Diagnosis not present

## 2022-11-09 DIAGNOSIS — I10 Essential (primary) hypertension: Secondary | ICD-10-CM | POA: Diagnosis not present

## 2022-11-09 DIAGNOSIS — K5903 Drug induced constipation: Secondary | ICD-10-CM | POA: Diagnosis not present

## 2022-11-09 DIAGNOSIS — N1831 Chronic kidney disease, stage 3a: Secondary | ICD-10-CM | POA: Diagnosis not present

## 2022-11-10 DIAGNOSIS — M170 Bilateral primary osteoarthritis of knee: Secondary | ICD-10-CM | POA: Diagnosis not present

## 2022-11-16 ENCOUNTER — Encounter: Payer: Self-pay | Admitting: Cardiology

## 2022-11-16 ENCOUNTER — Ambulatory Visit: Payer: Medicare Other | Attending: Cardiology | Admitting: Cardiology

## 2022-11-16 ENCOUNTER — Telehealth: Payer: Self-pay | Admitting: *Deleted

## 2022-11-16 VITALS — BP 100/63 | HR 80 | Ht 72.0 in | Wt 262.6 lb

## 2022-11-16 DIAGNOSIS — E669 Obesity, unspecified: Secondary | ICD-10-CM | POA: Insufficient documentation

## 2022-11-16 DIAGNOSIS — G4733 Obstructive sleep apnea (adult) (pediatric): Secondary | ICD-10-CM

## 2022-11-16 DIAGNOSIS — E7800 Pure hypercholesterolemia, unspecified: Secondary | ICD-10-CM | POA: Insufficient documentation

## 2022-11-16 DIAGNOSIS — E66811 Obesity, class 1: Secondary | ICD-10-CM | POA: Insufficient documentation

## 2022-11-16 DIAGNOSIS — I10 Essential (primary) hypertension: Secondary | ICD-10-CM | POA: Insufficient documentation

## 2022-11-16 DIAGNOSIS — K2950 Unspecified chronic gastritis without bleeding: Secondary | ICD-10-CM | POA: Insufficient documentation

## 2022-11-16 DIAGNOSIS — Z86010 Personal history of colonic polyps: Secondary | ICD-10-CM | POA: Insufficient documentation

## 2022-11-16 NOTE — Telephone Encounter (Signed)
Dr. Radford Pax ordered an Itamar study today. Pt agreeable to signed waiver and to not open the box until he has been called with PIN#.

## 2022-11-16 NOTE — Progress Notes (Signed)
Date:  11/16/2022   ID:  Joshua Raymond, DOB 01-30-1956, MRN [id number]  PCP:  Lajean Manes, MD  Cardiologist:  Glori Bickers, MD Electrophysiologist:  None   Chief Complaint:  OSA  History of Present Illness:    Joshua Raymond is a 66 y.o. male  with a hx of chronic systolic CHF, OSA not on CPAP, CKD stage 3 and fatigue.  He was referred by [facility] clinic to establish care with a sleep practitioner and get back on CPAP.  He was diagnosed with OSA over 20 years ago and has had 2 sleep studies with the last being in 2019 and he was placed back on CPAP after being off of it.    He complained of waking up at night with the pressure feeling too high so I decreased PAP to 13cm H2O.  He has to sleep in a recliner to sleep because the tossing and turning dislodges his mask.    At last OV he had stopped using his device. He was very frustrated with his masks.  He has tried every type of mask but the facial hair prevents a good seal and the mask moves in his sleep and he does not sleep well. He does not want to shave his beard.We discussed the Inspire device but his BMI excluded him at that time.  He was encouraged to lose weight so that we could possibly consider the hypoglossal nerve stimulator device.   Since I saw him last he has lost over 20lbs and he is on [facility] and is seeing a weight loss MD.  He says that now he is not sure that he wants to proceed with the [facility] device as he is not snoring anymore.  He still feels tired in the am but not as bad as before he lost weight.    Prior CV studies:   The following studies were reviewed today:  none  Past Medical History:  Diagnosis Date   CHF (congestive heart failure) (HCC)    Complication of anesthesia    GERD (gastroesophageal reflux disease)    Hypertension    PONV (postoperative nausea and vomiting)    Past Surgical History:  Procedure Laterality Date   bone spur surgery     CERVICAL DISCECTOMY N/A 1995   C5C6    COLONOSCOPY WITH PROPOFOL N/A 11/08/2016   Procedure: COLONOSCOPY WITH PROPOFOL;  Surgeon: Garlan Fair, MD;  Location: [facility];  Service: Endoscopy;  Laterality: N/A;   CORONARY ANGIOPLASTY     ESOPHAGOGASTRODUODENOSCOPY (EGD) WITH PROPOFOL N/A 11/08/2016   Procedure: ESOPHAGOGASTRODUODENOSCOPY (EGD) WITH PROPOFOL;  Surgeon: Garlan Fair, MD;  Location: [facility];  Service: Endoscopy;  Laterality: N/A;   KNEE ARTHROSCOPY Right 1973   KNEE SURGERY     LAMINECTOMY N/A 2002   LAMINECTOMY     RIGHT/LEFT HEART CATH AND CORONARY ANGIOGRAPHY N/A 03/24/2017   Procedure: Right/Left Heart Cath and Coronary Angiography;  Surgeon: Belva Crome, MD;  Location: [place] [facility];  Service: Cardiovascular;  Laterality: N/A;   ROTATOR CUFF REPAIR     SHOULDER ARTHROSCOPY W/ ROTATOR CUFF REPAIR Right 2000   TENDON LENGTHENING Left 2013   Achilles tendon extension     Current Meds  Medication Sig   atorvastatin (LIPITOR) 10 MG tablet Take 10 mg by mouth daily.   carvedilol (COREG) 12.5 MG tablet Take 1 tablet (12.5 mg total) by mouth 2 (two) times daily with a meal.   cetirizine (ZYRTEC) 10 MG  tablet Take 10 mg by mouth daily.   Cyanocobalamin (B-12) 1000 MCG TABS Take 1,000 mcg by mouth 2 (two) times daily.   fluticasone (FLONASE) 50 MCG/ACT nasal spray Place 1-2 sprays into both nostrils daily.   Melatonin 3 MG TABS Take 6 mg by mouth at bedtime as needed (sleep).    metFORMIN (GLUCOPHAGE-XR) 500 MG 24 hr tablet Take 500 mg by mouth daily.   MOUNJARO 5 MG/0.5ML Pen Inject 5 mg into the skin once a week. e   Multiple Vitamin (MULTIVITAMIN WITH MINERALS) TABS tablet Take 2 tablets by mouth daily.   sacubitril-valsartan (ENTRESTO) 97-103 MG Take 1 tablet by mouth 2 (two) times daily.   spironolactone (ALDACTONE) 25 MG tablet Take 1 tablet (25 mg total) by mouth daily. Needs appt for futher refills   traZODone (DESYREL) 50 MG tablet Take 25 mg by mouth at bedtime as needed for sleep.      Allergies:   Other   Social History   Tobacco Use   Smoking status: Former   Smokeless tobacco: Never   Tobacco comments:    stopped 34 years ago  Substance Use Topics   Alcohol use: Yes    Comment: moderation 1 drink a day during the week   Drug use: No     Family Hx: The patient's family history includes Heart attack in his father; Ovarian cancer in his mother.  ROS:   Please see the history of present illness.     All other systems reviewed and are negative.   Labs/Other Tests and Data Reviewed:    Recent Labs: No results found for requested labs within last 365 days.   Recent Lipid Panel Lab Results  Component Value Date/Time   CHOL 117 03/23/2017 05:18 AM   TRIG 62 03/23/2017 05:18 AM   HDL 33 (L) 03/23/2017 05:18 AM   CHOLHDL 3.5 03/23/2017 05:18 AM   LDLCALC 72 03/23/2017 05:18 AM    Wt Readings from Last 3 Encounters:  11/16/22 262 lb 9.6 oz (119.1 kg)  01/31/22 285 lb 1.6 oz (129.3 kg)  12/02/21 283 lb 8 oz (128.6 kg)     Objective:    Vital Signs:  BP 100/63   Pulse 80   Ht 6' (1.829 m)   Wt 262 lb 9.6 oz (119.1 kg)   SpO2 98%   BMI 35.61 kg/m   GEN: Well nourished, well developed in no acute distress HEENT: Normal NECK: No JVD; No carotid bruits LYMPHATICS: No lymphadenopathy CARDIAC:RRR, no murmurs, rubs, gallops RESPIRATORY:  Clear to auscultation without rales, wheezing or rhonchi  ABDOMEN: Soft, non-tender, non-distended MUSCULOSKELETAL:  No edema; No deformity  SKIN: Warm and dry NEUROLOGIC:  Alert and oriented x 3 PSYCHIATRIC:  Normal affect  ASSESSMENT & PLAN:    1.  OSA  -unfortunately he is intolerant to the masks and has tried all of them -the masks do not seal well due to facial hair and he does not want to shave his beard -we discussed with hypoglossal nerve stimulator but his BMI is too high (needs to be 32 or lower and he is at 38) -he has been in a weight loss program and is on Mounjaro and has lost 20lbs and now  down to BMI of 35 -he is not sure that he still need to consider treatment for OSA given his weight loss and he is not snoring but is still feeling tired during the day -I have recommended that we repeat a home sleep study to  see how much residual OSA he has to determine if it needs to be treated and if AHI>15/hr then refer to Dr. Redmond Baseman with ENT.   Medication Adjustments/Labs and Tests Ordered: Current medicines are reviewed at length with the patient today.  Concerns regarding medicines are outlined above.  Tests Ordered: No orders of the defined types were placed in this encounter.   Medication Changes: No orders of the defined types were placed in this encounter.    Disposition:  Follow up in 1 year(s)  Signed, Fransico Him, MD  11/16/2022 2:00 PM    [place]

## 2022-11-16 NOTE — Patient Instructions (Addendum)
Medication Instructions:  Your physician recommends that you continue on your current medications as directed. Please refer to the Current Medication list given to you today.  *If you need a refill on your cardiac medications before your next appointment, please call your pharmacy*  Lab Work: If you have labs (blood work) drawn today and your tests are completely normal, you will receive your results only by: [place] (if you have MyChart) OR A paper copy in the mail If you have any lab test that is abnormal or we need to change your treatment, we will call you to review the results.  Testing/Procedures: Your physician ordered you to have a home sleep study [facility].  Follow-Up: At [facility], you and your health needs are our priority.  As part of our continuing mission to provide you with exceptional heart care, we have created designated Provider Care Teams.  These Care Teams include your primary Cardiologist (physician) and Advanced Practice Providers (APPs -  Physician Assistants and Nurse Practitioners) who all work together to provide you with the care you need, when you need it.  We recommend signing up for the patient portal called "MyChart".  Sign up information is provided on this After Visit Summary.  MyChart is used to connect with patients for Virtual Visits (Telemedicine).  Patients are able to view lab/test results, encounter notes, upcoming appointments, etc.  Non-urgent messages can be sent to your provider as well.   To learn more about what you can do with MyChart, go to [email or web address].    Your next appointment:   Will call you to schedule  The format for your next appointment:   In Person  Provider:   Dr. Radford Pax     Important Information About Sugar

## 2022-11-17 DIAGNOSIS — M170 Bilateral primary osteoarthritis of knee: Secondary | ICD-10-CM | POA: Diagnosis not present

## 2022-11-17 NOTE — Telephone Encounter (Signed)
Prior Authorization for ITAMAR sent to [employer] via web portal. Tracking Number  [id number]  

## 2022-11-18 NOTE — Telephone Encounter (Signed)
Pt has been made aware ok to proceed with sleep study. Pt will do study right after Christmas but before new years. Pt has been given PIN# [id number].   Called and made the patient aware that he may proceed with the [facility] Sleep Study. PIN # provided to the patient. Patient made aware that he will be contacted after the test has been read with the results and any recommendations. Patient verbalized understanding and thanked me for the call.

## 2022-11-22 ENCOUNTER — Encounter (HOSPITAL_BASED_OUTPATIENT_CLINIC_OR_DEPARTMENT_OTHER): Payer: Medicare Other | Admitting: Cardiology

## 2022-11-22 ENCOUNTER — Ambulatory Visit: Payer: Medicare Other | Attending: Cardiology

## 2022-11-22 DIAGNOSIS — G4733 Obstructive sleep apnea (adult) (pediatric): Secondary | ICD-10-CM | POA: Diagnosis not present

## 2022-11-23 NOTE — Procedures (Signed)
SLEEP STUDY REPORT Patient Information Study Date: 11/22/2022 Patient Name: Joshua Raymond Patient ID: [id number] Birth Date: [birth year] Age: 66 Gender: Male BMI: 35.5 (W=262 lb, H=6' 0'') Stopbang: 6 Referring Physician: Fransico Him, MD  TEST DESCRIPTION: Home sleep apnea testing was completed using the WatchPat, a Type 1 device, utilizing peripheral arterial tonometry (PAT), chest movement, actigraphy, pulse oximetry, pulse rate, body position and snore.  AHI was calculated with apnea and hypopnea using valid sleep time as the denominator. RDI includes apneas, hypopneas, and RERAs.  The data acquired and the scoring of sleep and all associated events were performed in accordance with the recommended standards and specifications as outlined in the AASM Manual for the Scoring of Sleep and Associated Events 2.2.0 (2015).  FINDINGS:  1.  Severe Obstructive Sleep Apnea with AHI 50/hr.   2.  Mild Central Sleep Apnea with pAHIc 14.5/hr.  3.  Oxygen desaturations as low as 79%.  4.  Severe snoring was present. O2 sats were < 88% for 85.1 min.  5.  Total sleep time was 7 hrs and 35 min.  6.  9.2% of total sleep time was spent in REM sleep.   7.  Shortened sleep onset latency at 6 min.   8.  Prolonged REM sleep onset latency at 267 min.   9.  Total awakenings were 11.  10. Arrhythmia detection:  None.  DIAGNOSIS:   Severe Obstructive Sleep Apnea (G47.33) Nocturnal Hypoxemia  RECOMMENDATIONS:   1.  Clinical correlation of these findings is necessary.  The decision to treat obstructive sleep apnea (OSA) is usually based on the presence of apnea symptoms or the presence of associated medical conditions such as Hypertension, Congestive Heart Failure, Atrial Fibrillation or Obesity.  The most common symptoms of OSA are snoring, gasping for breath while sleeping, daytime sleepiness and fatigue.   2.  Initiating apnea therapy is recommended given the presence of symptoms and/or associated  conditions. Recommend proceeding with one of the following:     a.  Auto-CPAP therapy with a pressure range of 5-20cm H2O.     b.  An oral appliance (OA) that can be obtained from certain dentists with expertise in sleep medicine.  These are primarily of use in non-obese patients with mild and moderate disease.     c.  An ENT consultation which may be useful to look for specific causes of obstruction and possible treatment options.     d.  If patient is intolerant to PAP therapy, consider referral to ENT for evaluation for hypoglossal nerve stimulator.   3.  Close follow-up is necessary to ensure success with CPAP or oral appliance therapy for maximum benefit.  4.  A follow-up oximetry study on CPAP is recommended to assess the adequacy of therapy and determine the need for supplemental oxygen or the potential need for Bi-level therapy.  An arterial blood gas to determine the adequacy of baseline ventilation and oxygenation should also be considered.  5.  Healthy sleep recommendations include:  adequate nightly sleep (normal 7-9 hrs/night), avoidance of caffeine after noon and alcohol near bedtime, and maintaining a sleep environment that is cool, dark and quiet.  6.  Weight loss for overweight patients is recommended.  Even modest amounts of weight loss can significantly improve the severity of sleep apnea.  7.  Snoring recommendations include:  weight loss where appropriate, side sleeping, and avoidance of alcohol before bed.  8.  Operation of motor vehicle should be avoided when sleepy.  Signature: Fransico Him,  MD; Leonidas Romberg; [place], [place] Board of Sleep Medicine Electronically Signed: 11/23/2022

## 2022-11-29 ENCOUNTER — Ambulatory Visit: Payer: Medicare Other | Admitting: Cardiology

## 2022-12-07 DIAGNOSIS — N1831 Chronic kidney disease, stage 3a: Secondary | ICD-10-CM | POA: Diagnosis not present

## 2022-12-07 DIAGNOSIS — E1169 Type 2 diabetes mellitus with other specified complication: Secondary | ICD-10-CM | POA: Diagnosis not present

## 2022-12-07 DIAGNOSIS — E65 Localized adiposity: Secondary | ICD-10-CM | POA: Diagnosis not present

## 2022-12-07 DIAGNOSIS — G4733 Obstructive sleep apnea (adult) (pediatric): Secondary | ICD-10-CM | POA: Diagnosis not present

## 2022-12-28 ENCOUNTER — Other Ambulatory Visit (HOSPITAL_COMMUNITY): Payer: Self-pay | Admitting: Cardiology

## 2023-01-04 DIAGNOSIS — E65 Localized adiposity: Secondary | ICD-10-CM | POA: Diagnosis not present

## 2023-01-04 DIAGNOSIS — G4733 Obstructive sleep apnea (adult) (pediatric): Secondary | ICD-10-CM | POA: Diagnosis not present

## 2023-01-04 DIAGNOSIS — E1169 Type 2 diabetes mellitus with other specified complication: Secondary | ICD-10-CM | POA: Diagnosis not present

## 2023-01-08 ENCOUNTER — Other Ambulatory Visit (HOSPITAL_COMMUNITY): Payer: Self-pay | Admitting: Cardiology

## 2023-02-01 DIAGNOSIS — E1169 Type 2 diabetes mellitus with other specified complication: Secondary | ICD-10-CM | POA: Diagnosis not present

## 2023-02-03 ENCOUNTER — Telehealth: Payer: Self-pay | Admitting: *Deleted

## 2023-02-03 DIAGNOSIS — G4733 Obstructive sleep apnea (adult) (pediatric): Secondary | ICD-10-CM

## 2023-02-03 DIAGNOSIS — I10 Essential (primary) hypertension: Secondary | ICD-10-CM

## 2023-02-03 NOTE — Telephone Encounter (Signed)
-----   Message from Lauralee Evener, [place] sent at 11/29/2022 10:48 AM EST -----  ----- Message ----- From: Sueanne Margarita, MD Sent: 11/23/2022   5:35 PM EST To: Cv Div Sleep Studies  Please let patient know that they have sleep apnea.  Recommend therapeutic CPAP titration ASAP for treatment of patient's sleep disordered breathing.  If unable to perform an in lab titration then initiate ResMed auto CPAP from 4 to 15cm H2O with heated humidity and mask of choice and overnight pulse ox on CPAP.

## 2023-02-03 NOTE — Telephone Encounter (Signed)
Returned Call: The patient has been notified of the result and verbalized understanding.  All questions (if any) were answered. Marolyn Hammock, [place] 02/03/2023 [id number] AM    Will precert titration

## 2023-02-03 NOTE — Telephone Encounter (Signed)
The patient has been notified of the result. Left detailed message on voicemail and informed patient to call back..Pinchas Reither Green, CMA   

## 2023-02-09 DIAGNOSIS — L570 Actinic keratosis: Secondary | ICD-10-CM | POA: Diagnosis not present

## 2023-02-09 DIAGNOSIS — L821 Other seborrheic keratosis: Secondary | ICD-10-CM | POA: Diagnosis not present

## 2023-02-09 DIAGNOSIS — D225 Melanocytic nevi of trunk: Secondary | ICD-10-CM | POA: Diagnosis not present

## 2023-02-09 DIAGNOSIS — Z85828 Personal history of other malignant neoplasm of skin: Secondary | ICD-10-CM | POA: Diagnosis not present

## 2023-02-09 DIAGNOSIS — L578 Other skin changes due to chronic exposure to nonionizing radiation: Secondary | ICD-10-CM | POA: Diagnosis not present

## 2023-02-09 DIAGNOSIS — Z86008 Personal history of in-situ neoplasm of other site: Secondary | ICD-10-CM | POA: Diagnosis not present

## 2023-02-14 ENCOUNTER — Other Ambulatory Visit: Payer: Self-pay

## 2023-02-14 MED ORDER — SPIRONOLACTONE 25 MG PO TABS: 25.0000 mg | ORAL_TABLET | Freq: Every day | ORAL | 2 refills | Status: DC

## 2023-02-15 NOTE — Addendum Note (Signed)
Addended by: Freada Bergeron on: 02/15/2023 06:19 PM   Modules accepted: Orders

## 2023-03-01 DIAGNOSIS — E1169 Type 2 diabetes mellitus with other specified complication: Secondary | ICD-10-CM | POA: Diagnosis not present

## 2023-03-13 ENCOUNTER — Ambulatory Visit (HOSPITAL_BASED_OUTPATIENT_CLINIC_OR_DEPARTMENT_OTHER): Payer: Medicare Other | Attending: Cardiology | Admitting: Cardiology

## 2023-03-13 VITALS — Ht 72.0 in | Wt 245.0 lb

## 2023-03-13 DIAGNOSIS — G4733 Obstructive sleep apnea (adult) (pediatric): Secondary | ICD-10-CM | POA: Diagnosis not present

## 2023-03-13 DIAGNOSIS — I10 Essential (primary) hypertension: Secondary | ICD-10-CM | POA: Diagnosis not present

## 2023-03-14 NOTE — Procedures (Signed)
    Patient Name: Joshua Raymond, Joshua Raymond Date: 03/13/2023 Gender: Male D.O.B: 05/09/1956 Age (years): 72 Referring Provider: Armanda Magic MD, ABSM Height (inches): 72 Interpreting Physician: Armanda Magic MD, ABSM Weight (lbs): 245 RPSGT: Ulyess Mort BMI: 33 MRN: [id number] Neck Size: 17.00  CLINICAL INFORMATION The patient is referred for a BiPAP titration to treat sleep apnea.  SLEEP STUDY TECHNIQUE As per the AASM Manual for the Scoring of Sleep and Associated Events v2.3 (April 2016) with a hypopnea requiring 4% desaturations.  The channels recorded and monitored were frontal, central and occipital EEG, electrooculogram (EOG), submentalis EMG (chin), nasal and oral airflow, thoracic and abdominal wall motion, anterior tibialis EMG, snore microphone, electrocardiogram, and pulse oximetry. Bilevel positive airway pressure (BPAP) was initiated at the beginning of the study and titrated to treat sleep-disordered breathing.  MEDICATIONS Medications self-administered by patient taken the night of the study : N/A  RESPIRATORY PARAMETERS Optimal IPAP Pressure (cm): 24  AHI at Optimal Pressure (/hr) 0 Optimal EPAP Pressure (cm):20  Overall Minimal O2 (%):81.0  Minimal O2 at Optimal Pressure (%): 93.0  SLEEP ARCHITECTURE Start Time:10:51:47 PM  Stop Time:5:01:55 AM  Total Time (min):370.1  Total Sleep Time (min):327 Sleep Latency (min):0.4  Sleep Efficiency (%): 88.3%  REM Latency (min):123.5  WASO (min):42.7 Stage N1 (%): 11.5%  Stage N2 (%): 63.0%  Stage N3 (%): 0.0%  Stage R (%):25.5 Supine (%):74.64  Arousal Index (/hr):25.5   CARDIAC DATA The 2 lead EKG demonstrated sinus rhythm. The mean heart rate was 69.8 beats per minute. Other EKG findings include: PVCs.  LEG MOVEMENT DATA The total Periodic Limb Movements of Sleep (PLMS) were 0. The PLMS index was 0.0. A PLMS index of <15 is considered normal in adults.  IMPRESSIONS - An optimal PAP pressure was selected  for this patient ( 24 / cm of water) - Mild Central Sleep Apnea was noted during this titration (CAI = 5.5/h). - Moderete oxygen desaturations were observed during this titration (min O2 = 81.0%). - The patient snored with moderate snoring volume. - 2-lead EKG demonstrated: PVCs - Clinically significant periodic limb movements were not noted during this study. Arousals associated with PLMs were rare.  DIAGNOSIS - Obstructive Sleep Apnea (G47.33)  RECOMMENDATIONS - Trial of ResMed auto BiPAP therapy with IPAP max 24cm H2O, EPAP min 8cm H2O and PS 4cm H2O  with a Large size [employer]Paykel Full Face Evora Full mask and heated humidification. - Avoid alcohol, sedatives and other CNS depressants that may worsen sleep apnea and disrupt normal sleep architecture. - Sleep hygiene should be reviewed to assess factors that may improve sleep quality. - Weight management and regular exercise should be initiated or continued. - Return to Sleep Center for re-evaluation after 4 weeks of therapy  [Electronically signed] 03/14/2023 03:22 PM  Armanda Magic MD, ABSM Diplomate, American Board of Sleep Medicine

## 2023-03-16 ENCOUNTER — Telehealth: Payer: Self-pay | Admitting: *Deleted

## 2023-03-16 NOTE — Telephone Encounter (Signed)
The patient has been notified of the result. Left detailed message on voicemail and informed patient to call back..Anagabriela Jokerst Green, CMA   

## 2023-03-16 NOTE — Telephone Encounter (Signed)
-----   Message from Gaynelle Cage, CMA sent at 03/15/2023  9:14 AM EDT -----  ----- Message ----- From: Quintella Reichert, MD Sent: 03/14/2023   3:25 PM EDT To: Cv Div Sleep Studies  Patient still requiring high pressures for PAP - please find ouf is h has lost any weight and what current weight and BMI are - if he has lost weight then will refer to ENT for Inspires instead of BiPAP

## 2023-03-17 NOTE — Telephone Encounter (Signed)
Reached out to patient to find out his current weight and BMI but he could not recall it from his last office visit with the nutritionist. Patient will call us back with that information.

## 2023-04-06 DIAGNOSIS — E118 Type 2 diabetes mellitus with unspecified complications: Secondary | ICD-10-CM | POA: Diagnosis not present

## 2023-05-04 DIAGNOSIS — E118 Type 2 diabetes mellitus with unspecified complications: Secondary | ICD-10-CM | POA: Diagnosis not present

## 2023-06-07 DIAGNOSIS — E118 Type 2 diabetes mellitus with unspecified complications: Secondary | ICD-10-CM | POA: Diagnosis not present

## 2023-07-05 DIAGNOSIS — E118 Type 2 diabetes mellitus with unspecified complications: Secondary | ICD-10-CM | POA: Diagnosis not present

## 2023-08-02 DIAGNOSIS — E118 Type 2 diabetes mellitus with unspecified complications: Secondary | ICD-10-CM | POA: Diagnosis not present

## 2023-08-29 ENCOUNTER — Other Ambulatory Visit (HOSPITAL_COMMUNITY): Payer: Self-pay

## 2023-08-29 DIAGNOSIS — E118 Type 2 diabetes mellitus with unspecified complications: Secondary | ICD-10-CM | POA: Diagnosis not present

## 2023-08-29 MED ORDER — MOUNJARO 10 MG/0.5ML ~~LOC~~ SOAJ
10.0000 mg | SUBCUTANEOUS | 2 refills | Status: AC
  Filled 2023-08-29: qty 2, 28d supply, fill #0
  Filled 2023-09-30: qty 2, 28d supply, fill #1

## 2023-08-30 ENCOUNTER — Other Ambulatory Visit (HOSPITAL_COMMUNITY): Payer: Self-pay

## 2023-09-07 ENCOUNTER — Other Ambulatory Visit (HOSPITAL_COMMUNITY): Payer: Self-pay | Admitting: Cardiology

## 2023-09-12 DIAGNOSIS — I5022 Chronic systolic (congestive) heart failure: Secondary | ICD-10-CM | POA: Diagnosis not present

## 2023-09-12 DIAGNOSIS — I130 Hypertensive heart and chronic kidney disease with heart failure and stage 1 through stage 4 chronic kidney disease, or unspecified chronic kidney disease: Secondary | ICD-10-CM | POA: Diagnosis not present

## 2023-09-12 DIAGNOSIS — Z79899 Other long term (current) drug therapy: Secondary | ICD-10-CM | POA: Diagnosis not present

## 2023-09-12 DIAGNOSIS — E7800 Pure hypercholesterolemia, unspecified: Secondary | ICD-10-CM | POA: Diagnosis not present

## 2023-09-12 DIAGNOSIS — Z0000 Encounter for general adult medical examination without abnormal findings: Secondary | ICD-10-CM | POA: Diagnosis not present

## 2023-09-12 DIAGNOSIS — G4730 Sleep apnea, unspecified: Secondary | ICD-10-CM | POA: Diagnosis not present

## 2023-09-12 DIAGNOSIS — E118 Type 2 diabetes mellitus with unspecified complications: Secondary | ICD-10-CM | POA: Diagnosis not present

## 2023-09-12 DIAGNOSIS — R011 Cardiac murmur, unspecified: Secondary | ICD-10-CM | POA: Diagnosis not present

## 2023-09-13 ENCOUNTER — Other Ambulatory Visit (HOSPITAL_COMMUNITY): Payer: Self-pay | Admitting: Internal Medicine

## 2023-09-13 DIAGNOSIS — R011 Cardiac murmur, unspecified: Secondary | ICD-10-CM

## 2023-10-03 DIAGNOSIS — E118 Type 2 diabetes mellitus with unspecified complications: Secondary | ICD-10-CM | POA: Diagnosis not present

## 2023-10-04 ENCOUNTER — Ambulatory Visit (HOSPITAL_COMMUNITY): Payer: Medicare Other | Attending: Cardiovascular Disease

## 2023-10-04 DIAGNOSIS — R011 Cardiac murmur, unspecified: Secondary | ICD-10-CM | POA: Diagnosis not present

## 2023-10-04 LAB — ECHOCARDIOGRAM COMPLETE
Area-P 1/2: 4.11 cm2
MV M vel: 4.87 m/s
MV Peak grad: 94.9 mm[Hg]
S' Lateral: 4 cm

## 2023-10-10 ENCOUNTER — Encounter: Payer: Self-pay | Admitting: Physician Assistant

## 2023-10-10 ENCOUNTER — Ambulatory Visit: Payer: Medicare Other | Attending: Physician Assistant | Admitting: Physician Assistant

## 2023-10-10 VITALS — BP 106/70 | HR 72 | Ht 72.0 in | Wt 234.4 lb

## 2023-10-10 DIAGNOSIS — I341 Nonrheumatic mitral (valve) prolapse: Secondary | ICD-10-CM | POA: Diagnosis not present

## 2023-10-10 DIAGNOSIS — I493 Ventricular premature depolarization: Secondary | ICD-10-CM

## 2023-10-10 DIAGNOSIS — N184 Chronic kidney disease, stage 4 (severe): Secondary | ICD-10-CM | POA: Diagnosis not present

## 2023-10-10 DIAGNOSIS — I340 Nonrheumatic mitral (valve) insufficiency: Secondary | ICD-10-CM

## 2023-10-10 DIAGNOSIS — I5043 Acute on chronic combined systolic (congestive) and diastolic (congestive) heart failure: Secondary | ICD-10-CM

## 2023-10-10 DIAGNOSIS — I10 Essential (primary) hypertension: Secondary | ICD-10-CM

## 2023-10-10 DIAGNOSIS — G4733 Obstructive sleep apnea (adult) (pediatric): Secondary | ICD-10-CM

## 2023-10-10 MED ORDER — CARVEDILOL 6.25 MG PO TABS: 6.2500 mg | ORAL_TABLET | Freq: Two times a day (BID) | ORAL | 3 refills | Status: DC

## 2023-10-10 MED ORDER — ATORVASTATIN CALCIUM 20 MG PO TABS: 20.0000 mg | ORAL_TABLET | Freq: Every evening | ORAL | 3 refills | Status: DC

## 2023-10-10 NOTE — Progress Notes (Signed)
Cardiology Office Note:  .   Date:  10/10/2023  ID:  Joshua Raymond, DOB Jul 27, 1956, MRN [id number] PCP: Joshua Aspen, MD  [facility] Providers Cardiologist:  Joshua Magic, MD Advanced Heart Failure:  Joshua Meres, MD  Sleep Medicine:  Joshua Magic, MD {  History of Present Illness: .   Joshua Raymond is a 67 y.o. male with a hx of chronic systolic CHF, OSA not on CPAP, CKD stage 3 and fatigue.  He was referred by [facility] to establish care with a sleep practitioner so was recently seen by Joshua Raymond to get back on CPAP.  Diagnosed with OSA over 20 years ago and had 2 sleep studies with last being 2.18.  Placed on CPAP after that.  As last visit, he has stopped using his device with mask.  Tried every day but had labs with patient) with c/o.  He was encouraged to lose some weight so that he could be considered for the hypoglossal nerve stimulator device.  Since he was last seen in our clinic he had lost over 20 pounds and was on Mounjaro and seeing weight loss specialist.  He was not sure if he wanted to go forward with the inspire device as he felt much better and was no longer snoring.  Still feels tired in the morning but not as bad as before his weight loss.  Today, he presents with a history of heart failure, sleep apnea, and type 2 diabetes, presents with concerns about their heart health. He was informed of a mitral valve abnormality and occasional PVCs. The patient reports no significant symptoms related to these conditions, such as swelling in the legs or shortness of breath. However, he has noticed a slight SOB  which he attributes to being out of shape rather than a symptom of heart failure.  The patient also discusses his struggle with sleep apnea and his interest in the Douds procedure. He reports that his BMI is now within the acceptable range for the procedure, but he is unsure if his sleep apnea has improved enough to warrant the procedure. He reports a  significant improvement in his sleep quality since starting [facility] for his type 2 diabetes, with less frequent waking up at night.  The patient also mentions issues with cholesterol management. Despite taking Lipitor twice a day, his LDL levels remain slightly elevated. He also reports issues with blood pressure management, with readings often on the lower side. He has been advised to reduce his dose of carvedilol to manage this.  Reports no shortness of breath nor dyspnea on exertion. Reports no chest pain, pressure, or tightness. No edema, orthopnea, PND. Reports no palpitations.   Discussed the use of AI scribe software for clinical note transcription with the patient, who gave verbal consent to proceed.   ROS: Pertinent ROS in HPI  Studies Reviewed: .       Echo 10/04/23 IMPRESSIONS     1. Left ventricular ejection fraction, by estimation, is 60 to 65%. The  left ventricle has normal function. The left ventricle has no regional  wall motion abnormalities. Left ventricular diastolic parameters are  consistent with Grade I diastolic  dysfunction (impaired relaxation).   2. Right ventricular systolic function is normal. The right ventricular  size is normal.   3. Left atrial size was mildly dilated.   4. Eccentric MR with splay artifact likely severe MR with postrior  prolapse consider f/u TEE with 3D imaging to further evaluate . The  mitral  valve is abnormal. No evidence of mitral valve regurgitation. No evidence  of mitral stenosis.   5. The aortic valve is tricuspid. There is mild calcification of the  aortic valve. Aortic valve regurgitation is not visualized. Aortic valve  sclerosis is present, with no evidence of aortic valve stenosis.   6. Aortic dilatation noted. There is moderate dilatation of the ascending  aorta, measuring 42 mm.   7. The inferior vena cava is normal in size with greater than 50%  respiratory variability, suggesting right atrial pressure of 3 mmHg.    FINDINGS   Left Ventricle: Left ventricular ejection fraction, by estimation, is 60  to 65%. The left ventricle has normal function. The left ventricle has no  regional wall motion abnormalities. The left ventricular internal cavity  size was normal in size. There is   no left ventricular hypertrophy. Left ventricular diastolic parameters  are consistent with Grade I diastolic dysfunction (impaired relaxation).   Right Ventricle: The right ventricular size is normal. No increase in  right ventricular wall thickness. Right ventricular systolic function is  normal.   Left Atrium: Left atrial size was mildly dilated.   Right Atrium: Right atrial size was normal in size.   Pericardium: There is no evidence of pericardial effusion.   Mitral Valve: Eccentric MR with splay artifact likely severe MR with  postrior prolapse consider f/u TEE with 3D imaging to further evaluate.  The mitral valve is abnormal. No evidence of mitral valve regurgitation.  No evidence of mitral valve stenosis.   Tricuspid Valve: The tricuspid valve is normal in structure. Tricuspid  valve regurgitation is not demonstrated. No evidence of tricuspid  stenosis.   Aortic Valve: The aortic valve is tricuspid. There is mild calcification  of the aortic valve. Aortic valve regurgitation is not visualized. Aortic  valve sclerosis is present, with no evidence of aortic valve stenosis.   Pulmonic Valve: The pulmonic valve was normal in structure. Pulmonic valve  regurgitation is not visualized. No evidence of pulmonic stenosis.   Aorta: Aortic dilatation noted. There is moderate dilatation of the  ascending aorta, measuring 42 mm.   Venous: The inferior vena cava is normal in size with greater than 50%  respiratory variability, suggesting right atrial pressure of 3 mmHg.   IAS/Shunts: No atrial level shunt detected by color flow Doppler.   LABS LDL: 116 HDL: 36 Triglycerides: 109 Potassium: 4.9 GFR:  64 BUN: 28  STOP-Bang Score:  6      Physical Exam:   VS:  BP 106/70   Pulse 72   Ht 6' (1.829 m)   Wt 234 lb 6.4 oz (106.3 kg)   SpO2 98%   BMI 31.79 kg/m    Wt Readings from Last 3 Encounters:  10/10/23 234 lb 6.4 oz (106.3 kg)  03/13/23 245 lb (111.1 kg)  11/16/22 262 lb 9.6 oz (119.1 kg)    GEN: Well nourished, well developed in no acute distress NECK: No JVD; No carotid bruits CARDIAC: RRR with occassional PVC, no murmurs, rubs, gallops RESPIRATORY:  Clear to auscultation without rales, wheezing or rhonchi  ABDOMEN: Soft, non-tender, non-distended EXTREMITIES:  No edema; No deformity   ASSESSMENT AND PLAN: .    Obstructive Sleep Apnea (OSA) Patient has lost weight and BMI is now 31, within the range for [facility] therapy. Patient is not currently using CPAP due to sinus issues and facial hair. -Contact Joshua Raymond to discuss potential for Inspire therapy based on recent sleep study and  current BMI.  Premature Ventricular Contractions (PVCs) Occasional PVCs noted on EKG, no symptoms reported. -Monitor for symptoms of increased frequency of PVCs such as fatigue, palpitations, or dizziness.  Mitral Valve Prolapse with Severe Regurgitation Recent echocardiogram showed severe leak and potential prolapse of the mitral valve. Patient reports mild SOB but no other symptoms of heart failure. -Contact Joshua Raymond to discuss potential for Transesophageal Echocardiogram (TEE) or repeat echocardiogram in 6 months. -Discuss potential for MitraClip procedure or valve replacement if symptoms develop.  Hyperlipidemia LDL slightly elevated at 116 on Lipitor 10mg  twice daily. -Change Lipitor to 20mg  once daily at bedtime for better absorption.  Hypertension Blood pressure low normal, currently on Carvedilol 12.5mg  daily. -Reduce Carvedilol to 6.25mg  twice daily. Monitor blood pressure and adjust dose if needed.  Type 2 Diabetes Well controlled on Mounjaro, recent HbA1c 5.6. -Continue  current management.  Chronic Kidney Disease Recent labs show normal GFR and potassium. -Continue current management, monitor labs regularly.  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 He has had this for 50 years -Continue current management.  General Health Maintenance -Continue Aspirin twice weekly for joint pain as needed. -Ensure patient has refills for all medications. -Follow up with Dr. Odis Hollingshead for evaluation of worsening edema and concerns for acute heart failure exacerbation.   Dispo: He can follow-up in 6 months with Joshua Raymond  Signed, Sharlene Dory, PA-C

## 2023-10-10 NOTE — Patient Instructions (Signed)
Medication Instructions:  Your physician has recommended you make the following change in your medication:   REDUCE the Carvedilol to 6.25 taking 1 twice a day  CHANGE the Atorvastatin to 20 mg taking 1 at night time  *If you need a refill on your cardiac medications before your next appointment, please call your pharmacy*   Lab Work: None ordered  If you have labs (blood work) drawn today and your tests are completely normal, you will receive your results only by: MyChart Message (if you have MyChart) OR A paper copy in the mail If you have any lab test that is abnormal or we need to change your treatment, we will call you to review the results.   Testing/Procedures: None ordered   Follow-Up: At [facility], you and your health needs are our priority.  As part of our continuing mission to provide you with exceptional heart care, we have created designated Provider Care Teams.  These Care Teams include your primary Cardiologist (physician) and Advanced Practice Providers (APPs -  Physician Assistants and Nurse Practitioners) who all work together to provide you with the care you need, when you need it.  We recommend signing up for the patient portal called "MyChart".  Sign up information is provided on this After Visit Summary.  MyChart is used to connect with patients for Virtual Visits (Telemedicine).  Patients are able to view lab/test results, encounter notes, upcoming appointments, etc.  Non-urgent messages can be sent to your provider as well.   To learn more about what you can do with MyChart, go to [email or web address].    Your next appointment:   6 month(s)  Provider:   Armanda Magic, MD     Other Instructions Your physician has requested that you regularly monitor and record your blood pressure readings at home. Please use the same machine at the same time of day to check your readings and record them to bring to your follow-up visit.   Please monitor blood  pressures and keep a log of your readings for 2 weeks and send them readings via mychart.    Make sure to check 2 hours after your medications.    AVOID these things for 30 minutes before checking your blood pressure: No Drinking caffeine. No Drinking alcohol. No Eating. No Smoking. No Exercising.   Five minutes before checking your blood pressure: Pee. Sit in a dining chair. Avoid sitting in a soft couch or armchair. Be quiet. Do not talk

## 2023-10-17 ENCOUNTER — Other Ambulatory Visit: Payer: Self-pay | Admitting: Medical Genetics

## 2023-10-17 DIAGNOSIS — Z006 Encounter for examination for normal comparison and control in clinical research program: Secondary | ICD-10-CM

## 2023-11-01 ENCOUNTER — Other Ambulatory Visit (HOSPITAL_COMMUNITY): Payer: Self-pay

## 2023-11-01 DIAGNOSIS — E118 Type 2 diabetes mellitus with unspecified complications: Secondary | ICD-10-CM | POA: Diagnosis not present

## 2023-11-01 DIAGNOSIS — E7800 Pure hypercholesterolemia, unspecified: Secondary | ICD-10-CM | POA: Diagnosis not present

## 2023-11-01 MED ORDER — MOUNJARO 7.5 MG/0.5ML ~~LOC~~ SOAJ
7.5000 mg | SUBCUTANEOUS | 2 refills | Status: DC
  Filled 2023-11-01: qty 2, 28d supply, fill #0
  Filled 2023-11-26: qty 2, 28d supply, fill #1
  Filled 2023-12-23: qty 2, 28d supply, fill #2

## 2023-11-03 ENCOUNTER — Other Ambulatory Visit (HOSPITAL_COMMUNITY)
Admission: RE | Admit: 2023-11-03 | Discharge: 2023-11-03 | Disposition: A | Discharge status: Home / Self Care | Payer: Self-pay | Source: Ambulatory Visit | Attending: Medical Genetics | Admitting: Medical Genetics

## 2023-11-03 DIAGNOSIS — Z006 Encounter for examination for normal comparison and control in clinical research program: Secondary | ICD-10-CM | POA: Insufficient documentation

## 2023-11-05 ENCOUNTER — Other Ambulatory Visit: Payer: Self-pay | Admitting: Cardiology

## 2023-11-21 LAB — GENECONNECT MOLECULAR SCREEN: Genetic Analysis Overall Interpretation: NEGATIVE

## 2023-11-28 ENCOUNTER — Other Ambulatory Visit (HOSPITAL_COMMUNITY): Payer: Self-pay

## 2023-12-14 DIAGNOSIS — E7800 Pure hypercholesterolemia, unspecified: Secondary | ICD-10-CM | POA: Diagnosis not present

## 2023-12-14 DIAGNOSIS — E118 Type 2 diabetes mellitus with unspecified complications: Secondary | ICD-10-CM | POA: Diagnosis not present

## 2024-01-11 DIAGNOSIS — E7800 Pure hypercholesterolemia, unspecified: Secondary | ICD-10-CM | POA: Diagnosis not present

## 2024-01-11 DIAGNOSIS — E118 Type 2 diabetes mellitus with unspecified complications: Secondary | ICD-10-CM | POA: Diagnosis not present

## 2024-01-26 ENCOUNTER — Other Ambulatory Visit (HOSPITAL_COMMUNITY): Payer: Self-pay

## 2024-02-02 ENCOUNTER — Other Ambulatory Visit (HOSPITAL_COMMUNITY): Payer: Self-pay

## 2024-02-06 ENCOUNTER — Other Ambulatory Visit (HOSPITAL_COMMUNITY): Payer: Self-pay

## 2024-02-06 MED ORDER — MOUNJARO 7.5 MG/0.5ML ~~LOC~~ SOAJ
7.5000 mg | SUBCUTANEOUS | 2 refills | Status: AC
  Filled 2024-02-06: qty 2, 28d supply, fill #0
  Filled 2024-02-29: qty 2, 28d supply, fill #1
  Filled 2024-03-30: qty 2, 28d supply, fill #2

## 2024-02-07 ENCOUNTER — Other Ambulatory Visit (HOSPITAL_COMMUNITY): Payer: Self-pay

## 2024-02-07 MED ORDER — MOUNJARO 7.5 MG/0.5ML ~~LOC~~ SOAJ
7.5000 mg | SUBCUTANEOUS | 2 refills | Status: DC
  Filled 2024-04-26: qty 2, 28d supply, fill #0
  Filled 2024-05-23: qty 2, 28d supply, fill #1
  Filled 2024-06-20: qty 2, 28d supply, fill #2

## 2024-02-08 DIAGNOSIS — E7800 Pure hypercholesterolemia, unspecified: Secondary | ICD-10-CM | POA: Diagnosis not present

## 2024-02-08 DIAGNOSIS — E118 Type 2 diabetes mellitus with unspecified complications: Secondary | ICD-10-CM | POA: Diagnosis not present

## 2024-02-13 DIAGNOSIS — L821 Other seborrheic keratosis: Secondary | ICD-10-CM | POA: Diagnosis not present

## 2024-02-13 DIAGNOSIS — L570 Actinic keratosis: Secondary | ICD-10-CM | POA: Diagnosis not present

## 2024-02-13 DIAGNOSIS — Z85828 Personal history of other malignant neoplasm of skin: Secondary | ICD-10-CM | POA: Diagnosis not present

## 2024-02-13 DIAGNOSIS — Z86008 Personal history of in-situ neoplasm of other site: Secondary | ICD-10-CM | POA: Diagnosis not present

## 2024-02-13 DIAGNOSIS — L578 Other skin changes due to chronic exposure to nonionizing radiation: Secondary | ICD-10-CM | POA: Diagnosis not present

## 2024-02-13 DIAGNOSIS — D225 Melanocytic nevi of trunk: Secondary | ICD-10-CM | POA: Diagnosis not present

## 2024-02-27 ENCOUNTER — Other Ambulatory Visit (HOSPITAL_COMMUNITY): Payer: Self-pay | Admitting: Cardiology

## 2024-03-02 ENCOUNTER — Other Ambulatory Visit (HOSPITAL_COMMUNITY): Payer: Self-pay

## 2024-03-13 DIAGNOSIS — I5022 Chronic systolic (congestive) heart failure: Secondary | ICD-10-CM | POA: Diagnosis not present

## 2024-03-13 DIAGNOSIS — I341 Nonrheumatic mitral (valve) prolapse: Secondary | ICD-10-CM | POA: Diagnosis not present

## 2024-03-13 DIAGNOSIS — R011 Cardiac murmur, unspecified: Secondary | ICD-10-CM | POA: Diagnosis not present

## 2024-03-13 DIAGNOSIS — E7800 Pure hypercholesterolemia, unspecified: Secondary | ICD-10-CM | POA: Diagnosis not present

## 2024-03-13 DIAGNOSIS — Z79899 Other long term (current) drug therapy: Secondary | ICD-10-CM | POA: Diagnosis not present

## 2024-03-13 DIAGNOSIS — G4730 Sleep apnea, unspecified: Secondary | ICD-10-CM | POA: Diagnosis not present

## 2024-03-13 DIAGNOSIS — E118 Type 2 diabetes mellitus with unspecified complications: Secondary | ICD-10-CM | POA: Diagnosis not present

## 2024-03-13 DIAGNOSIS — I129 Hypertensive chronic kidney disease with stage 1 through stage 4 chronic kidney disease, or unspecified chronic kidney disease: Secondary | ICD-10-CM | POA: Diagnosis not present

## 2024-03-19 DIAGNOSIS — E118 Type 2 diabetes mellitus with unspecified complications: Secondary | ICD-10-CM | POA: Diagnosis not present

## 2024-03-19 DIAGNOSIS — E7800 Pure hypercholesterolemia, unspecified: Secondary | ICD-10-CM | POA: Diagnosis not present

## 2024-04-24 ENCOUNTER — Ambulatory Visit: Admitting: Cardiology

## 2024-04-27 ENCOUNTER — Other Ambulatory Visit (HOSPITAL_COMMUNITY): Payer: Self-pay

## 2024-04-30 DIAGNOSIS — E118 Type 2 diabetes mellitus with unspecified complications: Secondary | ICD-10-CM | POA: Diagnosis not present

## 2024-04-30 DIAGNOSIS — E7800 Pure hypercholesterolemia, unspecified: Secondary | ICD-10-CM | POA: Diagnosis not present

## 2024-05-05 NOTE — Progress Notes (Signed)
 Cardiology Office Note    Date:  05/10/2024  ID:  Joshua Raymond, DOB [birth year], MRN [id number] PCP:  Joshua Bradley, MD  Cardiologist:  Joshua Keas, MD  Electrophysiologist:  None   Chief Complaint: Follow up for mitral valve regurgitation   History of Present Illness: .    Joshua Raymond is a 68 y.o. male with visit-pertinent history of heart failure, NICM with normal course by cath in 02/2017 sleep apnea and type 2 diabetes.  Patient was admitted 03/22/2017 02/24/2017 with acute on chronic systolic CHF.  Cardiac cath showed no obstructive CAD.  Right and left heart cath on 03/24/2017 with normal coronaries, Fick cardiac output/index 4.52/1.92, LVEDP 31.  Cardiac MRI on 03/27/2017 showed EF of 15%, severe LV dilation, LGE pattern with patchy, mainly mid wall LGE that may be consistent with myocarditis.  There was some question of LV thrombus but after review of his echo with contrast was felt that there was no LV thrombus, rather trabeculation adjacent to an area of LGE.  Echo in 2021 indicated EF of 50%, G1 DD.  Echocardiogram on 10/04/2023 indicated LVEF of 60 to 65%, no RWMA, G1 DD, RV systolic function and size was normal, LA was mildly dilated, there was eccentric MR with splay artifact likely severe MR with posterior prolapse, there was mild calcification of the aortic valve, aortic valve regurgitation was not visualized, aortic valve sclerosis was present with no evidence of stenosis.  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  Today he presents for follow-up.  He reports that he is doing very well overall.  He denies any chest pain, shortness of breath, lower extremity edema, orthopnea or PND.  He is going to the gym 3 times a week and tolerating very well he also walks his dog a few times a day.  He denies any palpitations, presyncope or syncope.  ROS: .   Today he denies chest pain, shortness of breath, lower extremity edema, fatigue, palpitations,  melena, hematuria, hemoptysis, diaphoresis, weakness, presyncope, syncope, orthopnea, and PND.  All other systems are reviewed and otherwise negative. Studies Reviewed: Joshua Raymond   EKG:  EKG is ordered today, personally reviewed, demonstrating  EKG Interpretation Date/Time:  Wednesday May 08 2024 16:14:05 EDT Ventricular Rate:  80 PR Interval:  172 QRS Duration:  104 QT Interval:  378 QTC Calculation: 435 R Axis:   35  Text Interpretation: Normal sinus rhythm Nonspecific T wave abnormality When compared with ECG of 10-Oct-2023 14:40, Premature ventricular complexes are no longer Present Confirmed by Joshua Raymond [phone]) on 05/08/2024 5:53:42 PM   CV Studies: Cardiac studies reviewed are outlined and summarized above. Otherwise please see EMR for full report. Cardiac Studies & Procedures   ______________________________________________________________________________________________ CARDIAC CATHETERIZATION  CARDIAC CATHETERIZATION 03/24/2017  Conclusion  Normal coronary arteries.  Moderate pulmonary hypertension.  Previously documented severe left ventricular systolic function with EF less than 20%. Severely elevated LVEDP and pulmonary capillary wedge pressure, 31 mmHg.  Findings are compatible with a nonischemic cardiomyopathy, producing acute on chronic systolic heart failure.  RECOMMENDATIONS:   Guideline mandated heart failure therapy.  Findings Coronary Findings Diagnostic  Dominance: Right  No diagnostic findings have been documented. Intervention  No interventions have been documented.     ECHOCARDIOGRAM  ECHOCARDIOGRAM COMPLETE 10/04/2023  Narrative ECHOCARDIOGRAM REPORT    Patient Name:   Joshua Raymond Date of Exam: 10/04/2023 Medical Rec #:  [id number]      Height:  72.0 in Accession #:    [id number]     Weight:       245.0 lb Date of Birth:  January 23, 1956      BSA:          2.322 m Patient Age:    67 years       BP:           100/63 mmHg Patient Gender: M               HR:           82 bpm. Exam Location:  [address]  Procedure: 2D Echo, 3D Echo, Cardiac Doppler and Color Doppler  Indications:    R01.1 Murmur  History:        Patient has prior history of Echocardiogram examinations, most recent 06/17/2020. CHF, Signs/Symptoms:Murmur; Risk Factors:Hypertension, Diabetes, Former Smoker and Family History of Coronary Artery Disease. Prior EF 45-50%.  Sonographer:    Joshua Raymond RDCS Referring Phys: Joshua Raymond  IMPRESSIONS   1. Left ventricular ejection fraction, by estimation, is 60 to 65%. The left ventricle has normal function. The left ventricle has no regional wall motion abnormalities. Left ventricular diastolic parameters are consistent with Grade I diastolic dysfunction (impaired relaxation). 2. Right ventricular systolic function is normal. The right ventricular size is normal. 3. Left atrial size was mildly dilated. 4. Eccentric MR with splay artifact likely severe MR with postrior prolapse consider f/u TEE with 3D imaging to further evaluate . The mitral valve is abnormal. No evidence of mitral valve regurgitation. No evidence of mitral stenosis. 5. The aortic valve is tricuspid. There is mild calcification of the aortic valve. Aortic valve regurgitation is not visualized. Aortic valve sclerosis is present, with no evidence of aortic valve stenosis. 6. Aortic dilatation noted. There is moderate dilatation of the ascending aorta, measuring 42 mm. 7. The inferior vena cava is normal in size with greater than 50% respiratory variability, suggesting right atrial pressure of 3 mmHg.  FINDINGS Left Ventricle: Left ventricular ejection fraction, by estimation, is 60 to 65%. The left ventricle has normal function. The left ventricle has no regional wall motion abnormalities. The left ventricular internal cavity size was normal in size. There is no left ventricular hypertrophy. Left ventricular diastolic parameters are  consistent with Grade I diastolic dysfunction (impaired relaxation).  Right Ventricle: The right ventricular size is normal. No increase in right ventricular wall thickness. Right ventricular systolic function is normal.  Left Atrium: Left atrial size was mildly dilated.  Right Atrium: Right atrial size was normal in size.  Pericardium: There is no evidence of pericardial effusion.  Mitral Valve: Eccentric MR with splay artifact likely severe MR with postrior prolapse consider f/u TEE with 3D imaging to further evaluate. The mitral valve is abnormal. No evidence of mitral valve regurgitation. No evidence of mitral valve stenosis.  Tricuspid Valve: The tricuspid valve is normal in structure. Tricuspid valve regurgitation is not demonstrated. No evidence of tricuspid stenosis.  Aortic Valve: The aortic valve is tricuspid. There is mild calcification of the aortic valve. Aortic valve regurgitation is not visualized. Aortic valve sclerosis is present, with no evidence of aortic valve stenosis.  Pulmonic Valve: The pulmonic valve was normal in structure. Pulmonic valve regurgitation is not visualized. No evidence of pulmonic stenosis.  Aorta: Aortic dilatation noted. There is moderate dilatation of the ascending aorta, measuring 42 mm.  Venous: The inferior vena cava is normal in size with greater than 50% respiratory variability, suggesting right  atrial pressure of 3 mmHg.  IAS/Shunts: No atrial level shunt detected by color flow Doppler.   LEFT VENTRICLE PLAX 2D LVIDd:         5.50 cm   Diastology LVIDs:         4.00 cm   LV e' medial:    6.53 cm/s LV PW:         0.90 cm   LV E/e' medial:  8.7 LV IVS:        0.90 cm   LV e' lateral:   11.40 cm/s LVOT diam:     2.70 cm   LV E/e' lateral: 5.0 LV SV:         74 LV SV Index:   32 LVOT Area:     5.73 cm  3D Volume EF: 3D EF:        53 % LV EDV:       131 ml LV ESV:       61 ml LV SV:        69 ml  RIGHT VENTRICLE RV Basal diam:   3.60 cm RV S prime:     13.80 cm/s TAPSE (M-mode): 2.9 cm  LEFT ATRIUM             Index        RIGHT ATRIUM           Index LA diam:        3.80 cm 1.64 cm/m   RA Pressure: 3.00 mmHg LA Vol (A2C):   73.3 ml 31.56 ml/m  RA Area:     17.40 cm LA Vol (A4C):   65.3 ml 28.12 ml/m  RA Volume:   48.60 ml  20.93 ml/m LA Biplane Vol: 69.7 ml 30.01 ml/m AORTIC VALVE LVOT Vmax:   66.70 cm/s LVOT Vmean:  46.200 cm/s LVOT VTI:    0.130 m  AORTA Ao Root diam: 3.50 cm Ao Asc diam:  4.20 cm  MITRAL VALVE               TRICUSPID VALVE MV Area (PHT): cm         Estimated RAP:  3.00 mmHg MV Decel Time: 185 msec MR Peak grad: 94.9 mmHg    SHUNTS MR Mean grad: 63.0 mmHg    Systemic VTI:  0.13 m MR Vmax:      487.00 cm/s  Systemic Diam: 2.70 cm MR Vmean:     378.0 cm/s MV E velocity: 56.77 cm/s MV A velocity: 85.60 cm/s MV E/A ratio:  0.66  Janelle Mediate MD Electronically signed by Janelle Mediate MD Signature Date/Time: 10/04/2023/3:05:43 PM    Final        CARDIAC MRI  MR CARDIAC MORPHOLOGY W WO CONTRAST 03/27/2017  Narrative CLINICAL DATA:  Cardiomyopathy of uncertain etiology  EXAM: CARDIAC MRI  TECHNIQUE: The patient was scanned on a 1.5 Tesla GE magnet. A dedicated cardiac coil was used. Functional imaging was done using Fiesta sequences. 2,3, and 4 chamber views were done to assess for RWMA's. Modified Simpson's rule using a short axis stack was used to calculate an ejection fraction on a dedicated work [employer]. The patient received 28 cc of Multihance . After 10 minutes inversion recovery sequences were used to assess for infiltration and scar tissue.  FINDINGS: Limited images of the lung fields showed a moderate right pleural effusion.  Severely dilated left ventricle, normal wall thickness. Severe diffuse hypokinesis, EF 14%. The right ventricle was mildly dilated with mildly  decreased systolic function. Moderate left atrial enlargement.  Mild right atrial enlargement. Trileaflet aortic valve with no stenosis or regurgitation. There was moderate mitral regurgitation (central), suspect due to annular dilatation.  On delayed enhancement imaging, there was an area of mid-wall late gadolinium enhancement (LGE) in the basal inferolateral wall. There was a discrete are of full thickness to subepicardial LGE in the mid inferior wall. There was a small area of subepicardial insertion site LGE in the mid inferoseptal wall. There was patchy mid-wall LGE at the apex.  MEASUREMENTS: MEASUREMENTS LVEDV 439 mL  LVSV 63 mL  LVEF 14%  IMPRESSION: 1. Severely dilated left ventricle with severe diffuse hypokinesis, EF 14%.  2.  Mildly dilated RV with mildly decreased systolic function.  3.  Moderate central mitral regurgitation, likely secondary.  4. The LGE pattern appears to be non-coronary, possibly related to prior myocarditis.  5. There is one area at the apex that I questioned for LV thrombus based on the LGE images. However, on review with colleagues and review of the contrast-enhanced echo, I do not think that thrombus is present.  Dalton Mclean   Electronically Signed By: Peder Bourdon M.D. On: 03/28/2017 07:32   ______________________________________________________________________________________________       Current Reported Medications:.    Current Meds  Medication Sig   carvedilol  (COREG ) 6.25 MG tablet Take 1 tablet (6.25 mg total) by mouth 2 (two) times daily.   cetirizine (ZYRTEC) 10 MG tablet Take 10 mg by mouth daily.   Cyanocobalamin (B-12) 1000 MCG TABS Take 1,000 mcg by mouth 2 (two) times daily.   fluticasone  (FLONASE ) 50 MCG/ACT nasal spray Place 1-2 sprays into both nostrils daily.   metFORMIN (GLUCOPHAGE-XR) 500 MG 24 hr tablet Take 500 mg by mouth daily.   Multiple Vitamin (MULTIVITAMIN WITH MINERALS) TABS tablet Take 2 tablets by mouth daily.   sacubitril -valsartan  (ENTRESTO ) 97-103  MG Take 1 tablet by mouth 2 (two) times daily.   spironolactone  (ALDACTONE ) 25 MG tablet TAKE 1 TABLET (25 MG TOTAL) BY MOUTH DAILY.   tirzepatide  (MOUNJARO ) 7.5 MG/0.5ML Pen Inject 7.5 mg into the skin once a week.    Physical Exam:    VS:  BP 108/68   Pulse 80   Ht 6' (1.829 m)   Wt 225 lb (102.1 kg)   SpO2 98%   BMI 30.52 kg/m    Wt Readings from Last 3 Encounters:  05/08/24 225 lb (102.1 kg)  10/10/23 234 lb 6.4 oz (106.3 kg)  03/13/23 245 lb (111.1 kg)    GEN: Well nourished, well developed in no acute distress NECK: No JVD; No carotid bruits CARDIAC: RRR, 2/6 systolic murmur, rubs, gallops RESPIRATORY:  Clear to auscultation without rales, wheezing or rhonchi  ABDOMEN: Soft, non-tender, non-distended EXTREMITIES:  No edema; No acute deformity     Asessement and Plan:.    OSA: Patient previously unable to use CPAP device due to sinus issues and facial hair, notes that he is unable to tolerate due to claustrophobia.  He would like to possibly consider the inspire device however would like to get echocardiogram completed beforehand.  Mitral valve prolapse with severe regurgitation: Echocardiogram in 09/2023 showed severe leak and potential prolapse of the mitral valve.  Today he reports that he is doing very well, he denies any chest pain, lower extremity edema or shortness of breath.  He remains extremely active regularly exercising.  Will check echocardiogram given prior results.  Hyperlipidemia: Continue Lipitor  20 mg once daily.  Hypertension: Blood pressure  today 108/68.  Continue current antihypertensive regimen.  Type 2 DM: Last hemoglobin A1c 5.7 on/16/2025.  CKD: Last creatinine 1.08 on/16/25.  Monitor managed per PCP.    Disposition: F/u with Emmalynn Pinkham, NP in 8 weeks.   Signed, Briarrose Shor D Gervis Gaba, NP

## 2024-05-08 ENCOUNTER — Ambulatory Visit: Attending: Cardiology | Admitting: Cardiology

## 2024-05-08 ENCOUNTER — Encounter: Payer: Self-pay | Admitting: Cardiology

## 2024-05-08 VITALS — BP 108/68 | HR 80 | Ht 72.0 in | Wt 225.0 lb

## 2024-05-08 DIAGNOSIS — G4733 Obstructive sleep apnea (adult) (pediatric): Secondary | ICD-10-CM | POA: Diagnosis not present

## 2024-05-08 DIAGNOSIS — I340 Nonrheumatic mitral (valve) insufficiency: Secondary | ICD-10-CM

## 2024-05-08 DIAGNOSIS — I10 Essential (primary) hypertension: Secondary | ICD-10-CM | POA: Diagnosis not present

## 2024-05-08 DIAGNOSIS — I341 Nonrheumatic mitral (valve) prolapse: Secondary | ICD-10-CM

## 2024-05-08 DIAGNOSIS — N184 Chronic kidney disease, stage 4 (severe): Secondary | ICD-10-CM | POA: Diagnosis not present

## 2024-05-08 NOTE — Patient Instructions (Addendum)
 Medication Instructions:  No changes *If you need a refill on your cardiac medications before your next appointment, please call your pharmacy*  Lab Work: No labs  Testing/Procedures: Your physician has requested that you have an echocardiogram. Echocardiography is a painless test that uses sound waves to create images of your heart. It provides your doctor with information about the size and shape of your heart and how well your heart's chambers and valves are working. This procedure takes approximately one hour. There are no restrictions for this procedure. Please do NOT wear cologne, perfume, aftershave, or lotions (deodorant is allowed). Please arrive 15 minutes prior to your appointment time.  Please note: We ask at that you not bring children with you during ultrasound (echo/ vascular) testing. Due to room size and safety concerns, children are not allowed in the ultrasound rooms during exams. Our front office staff cannot provide observation of children in our lobby area while testing is being conducted. An adult accompanying a patient to their appointment will only be allowed in the ultrasound room at the discretion of the ultrasound technician under special circumstances. We apologize for any inconvenience.   Follow-Up: At [facility], you and your health needs are our priority.  As part of our continuing mission to provide you with exceptional heart care, our providers are all part of one team.  This team includes your primary Cardiologist (physician) and Advanced Practice Providers or APPs (Physician Assistants and Nurse Practitioners) who all work together to provide you with the care you need, when you need it.  Your next appointment:   2 month(s)  Provider:   Katlyn West, NP  We recommend signing up for the patient portal called MyChart.  Sign up information is provided on this After Visit Summary.  MyChart is used to connect with patients for Virtual Visits  (Telemedicine).  Patients are able to view lab/test results, encounter notes, upcoming appointments, etc.  Non-urgent messages can be sent to your provider as well.   To learn more about what you can do with MyChart, go to [email or web address].

## 2024-05-10 ENCOUNTER — Encounter: Payer: Self-pay | Admitting: Cardiology

## 2024-05-24 ENCOUNTER — Other Ambulatory Visit (HOSPITAL_COMMUNITY): Payer: Self-pay

## 2024-06-10 DIAGNOSIS — E7800 Pure hypercholesterolemia, unspecified: Secondary | ICD-10-CM | POA: Diagnosis not present

## 2024-06-10 DIAGNOSIS — E118 Type 2 diabetes mellitus with unspecified complications: Secondary | ICD-10-CM | POA: Diagnosis not present

## 2024-06-21 ENCOUNTER — Ambulatory Visit (HOSPITAL_COMMUNITY)

## 2024-07-15 ENCOUNTER — Ambulatory Visit: Admitting: Cardiology

## 2024-07-18 ENCOUNTER — Other Ambulatory Visit (HOSPITAL_COMMUNITY): Payer: Self-pay

## 2024-07-18 MED ORDER — MOUNJARO 7.5 MG/0.5ML ~~LOC~~ SOAJ
7.5000 mg | SUBCUTANEOUS | 3 refills | Status: AC
  Filled 2024-07-18: qty 2, 28d supply, fill #0
  Filled 2024-08-23: qty 2, 28d supply, fill #1
  Filled 2024-09-17: qty 2, 28d supply, fill #2
  Filled 2024-10-13: qty 2, 28d supply, fill #3

## 2024-07-22 DIAGNOSIS — E118 Type 2 diabetes mellitus with unspecified complications: Secondary | ICD-10-CM | POA: Diagnosis not present

## 2024-07-22 DIAGNOSIS — E7800 Pure hypercholesterolemia, unspecified: Secondary | ICD-10-CM | POA: Diagnosis not present

## 2024-07-24 ENCOUNTER — Ambulatory Visit (HOSPITAL_COMMUNITY)
Admission: RE | Admit: 2024-07-24 | Discharge: 2024-07-24 | Disposition: A | Discharge status: Home / Self Care | Source: Ambulatory Visit | Attending: Cardiology | Admitting: Cardiology

## 2024-07-24 DIAGNOSIS — I340 Nonrheumatic mitral (valve) insufficiency: Secondary | ICD-10-CM | POA: Insufficient documentation

## 2024-07-24 DIAGNOSIS — I341 Nonrheumatic mitral (valve) prolapse: Secondary | ICD-10-CM | POA: Diagnosis not present

## 2024-07-24 LAB — ECHOCARDIOGRAM COMPLETE
Area-P 1/2: 3.39 cm2
MV M vel: 5.07 m/s
MV Peak grad: 102.8 mmHg
S' Lateral: 3.7 cm

## 2024-07-26 ENCOUNTER — Ambulatory Visit: Payer: Self-pay | Admitting: Cardiology

## 2024-08-27 NOTE — Progress Notes (Unsigned)
 Cardiology Office Note    Date:  08/27/2024  ID:  Romani, Wilbon Jan 25, 1956, MRN [id number] PCP:  Charlott Dorn LABOR, MD  Cardiologist:  Wilbert Bihari, MD  Electrophysiologist:  None   Chief Complaint: ***  History of Present Illness: .    MAYCOL HOYING is a 68 y.o. male with visit-pertinent history of heart failure, NICM with normal course by cath in 02/2017 sleep apnea and type 2 diabetes.  Patient was admitted 03/22/2017 02/24/2017 with acute on chronic systolic CHF.  Cardiac cath showed no obstructive CAD.  Right and left heart cath on 03/24/2017 with normal coronaries, Fick cardiac output/index 4.52/1.92, LVEDP 31.  Cardiac MRI on 03/27/2017 showed EF of 15%, severe LV dilation, LGE pattern with patchy, mainly mid wall LGE that may be consistent with myocarditis.  There was some question of LV thrombus but after review of his echo with contrast was felt that there was no LV thrombus, rather trabeculation adjacent to an area of LGE.  Echo in 2021 indicated EF of 50%, G1 DD.  Echocardiogram on 10/04/2023 indicated LVEF of 60 to 65%, no RWMA, G1 DD, RV systolic function and size was normal, LA was mildly dilated, there was eccentric MR with splay artifact likely severe MR with posterior prolapse, there was mild calcification of the aortic valve, aortic valve regurgitation was not visualized, aortic valve sclerosis was present with no evidence of stenosis.  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  Patient was last in clinic on 05/08/2024 for follow-up.  He reported that he was doing very well overall.  He was regularly attending the gym 3 times a week and tolerating well.  Echocardiogram was ordered for monitoring of MR.  Echocardiogram on 07/24/2024 indicated prolapse of posterior MV leaflet with eccentric moderate to severe MR, LVEF 50 to 55%, low normal function, no RWMA, G1 DD, RV systolic function and size is normal, moderate to severe mitral valve regurgitation  with no evidence of stenosis, aortic valve regurgitation was not visualized, no stenosis present, there is borderline dilation of the ascending aorta measuring 38 mm.  Today he presents for follow-up.  He reports that he  Mitral valve prolapse with severe regurgitation: Echo in 09/2019 24 showed severe regurgitation and potential prolapse of the mitral valve.  Repeat echocardiogram on Today he reports Hyperlipidemia: Hypertension: Blood pressure today Type II DM: Last hemoglobin A1c 5.7% on 03/13/2024. CKD stage IV: Last creatinine  eGFR     Labwork independently reviewed:   ROS: .   *** denies chest pain, shortness of breath, lower extremity edema, fatigue, palpitations, melena, hematuria, hemoptysis, diaphoresis, weakness, presyncope, syncope, orthopnea, and PND.  All other systems are reviewed and otherwise negative.  Studies Reviewed: SABRA    EKG:  EKG is ordered today, personally reviewed, demonstrating ***     CV Studies: Cardiac studies reviewed are outlined and summarized above. Otherwise please see EMR for full report. Cardiac Studies & Procedures   ______________________________________________________________________________________________ CARDIAC CATHETERIZATION  CARDIAC CATHETERIZATION 03/24/2017  Conclusion  Normal coronary arteries.  Moderate pulmonary hypertension.  Previously documented severe left ventricular systolic function with EF less than 20%. Severely elevated LVEDP and pulmonary capillary wedge pressure, 31 mmHg.  Findings are compatible with a nonischemic cardiomyopathy, producing acute on chronic systolic heart failure.  RECOMMENDATIONS:   Guideline mandated heart failure therapy.  Findings Coronary Findings Diagnostic  Dominance: Right  No diagnostic findings have been documented. Intervention  No interventions have been documented.  ECHOCARDIOGRAM  ECHOCARDIOGRAM COMPLETE 07/24/2024  Narrative ECHOCARDIOGRAM  REPORT    Patient Name:   KENT RIENDEAU Date of Exam: 07/24/2024 Medical Rec #:  [id number]      Height:       72.0 in Accession #:    [id number]     Weight:       225.0 lb Date of Birth:  02-06-1956      BSA:          2.240 m Patient Age:    68 years       BP:           108/68 mmHg Patient Gender: M              HR:           76 bpm. Exam Location:  [address]  Procedure: 2D Echo, Cardiac Doppler and Color Doppler (Both Spectral and Color Flow Doppler were utilized during procedure).  Indications:    I34.0 Mitral Valve Disorder  History:        Patient has prior history of Echocardiogram examinations, most recent 10/04/2023. CHF; Risk Factors:Hypertension.  Sonographer:    Carl Rodgers-Jones RDCS Sonographer#2:  Edsel KERNS, Student Sonographer Referring Phys: [id number] Alliyah Roesler D Candie Gintz  IMPRESSIONS   1. Prolapse of posterior MV leaflet with eccentric moderate to severe MR; suggest TEE to further assess. 2. Left ventricular ejection fraction, by estimation, is 50 to 55%. The left ventricle has low normal function. The left ventricle has no regional wall motion abnormalities. Left ventricular diastolic parameters are consistent with Grade I diastolic dysfunction (impaired relaxation). 3. Right ventricular systolic function is normal. The right ventricular size is normal. 4. The mitral valve is normal in structure. Moderate to severe mitral valve regurgitation. No evidence of mitral stenosis. There is moderate holosystolic prolapse of posterior leaflet of the mitral valve. 5. The aortic valve is tricuspid. Aortic valve regurgitation is not visualized. No aortic stenosis is present. 6. Aortic dilatation noted. There is borderline dilatation of the ascending aorta, measuring 38 mm. 7. The inferior vena cava is normal in size with greater than 50% respiratory variability, suggesting right atrial pressure of 3 mmHg.  FINDINGS Left Ventricle: Left ventricular ejection fraction, by  estimation, is 50 to 55%. The left ventricle has low normal function. The left ventricle has no regional wall motion abnormalities. The left ventricular internal cavity size was normal in size. There is no left ventricular hypertrophy. Left ventricular diastolic parameters are consistent with Grade I diastolic dysfunction (impaired relaxation).  Right Ventricle: The right ventricular size is normal. Right ventricular systolic function is normal.  Left Atrium: Left atrial size was normal in size.  Right Atrium: Right atrial size was normal in size.  Pericardium: There is no evidence of pericardial effusion.  Mitral Valve: The mitral valve is normal in structure. There is moderate holosystolic prolapse of posterior leaflet of the mitral valve. Moderate to severe mitral valve regurgitation. No evidence of mitral valve stenosis.  Tricuspid Valve: The tricuspid valve is normal in structure. Tricuspid valve regurgitation is trivial. No evidence of tricuspid stenosis.  Aortic Valve: The aortic valve is tricuspid. Aortic valve regurgitation is not visualized. No aortic stenosis is present.  Pulmonic Valve: The pulmonic valve was normal in structure. Pulmonic valve regurgitation is trivial. No evidence of pulmonic stenosis.  Aorta: Aortic dilatation noted. There is borderline dilatation of the ascending aorta, measuring 38 mm.  Venous: The inferior vena cava is normal in size with greater than 50%  respiratory variability, suggesting right atrial pressure of 3 mmHg.  IAS/Shunts: No atrial level shunt detected by color flow Doppler.  Additional Comments: Prolapse of posterior MV leaflet with eccentric moderate to severe MR; suggest TEE to further assess.   LEFT VENTRICLE PLAX 2D LVIDd:         5.10 cm   Diastology LVIDs:         3.70 cm   LV e' medial:    5.90 cm/s LV PW:         0.80 cm   LV E/e' medial:  8.7 LV IVS:        0.90 cm   LV e' lateral:   18.40 cm/s LVOT diam:     2.20 cm   LV  E/e' lateral: 2.8 LV SV:         53 LV SV Index:   24 LVOT Area:     3.80 cm   RIGHT VENTRICLE             IVC RV Basal diam:  3.90 cm     IVC diam: 1.70 cm RV S prime:     12.50 cm/s TAPSE (M-mode): 2.0 cm  LEFT ATRIUM           Index        RIGHT ATRIUM           Index LA diam:      3.60 cm 1.61 cm/m   RA Area:     15.10 cm LA Vol (A4C): 46.7 ml 20.85 ml/m  RA Volume:   41.60 ml  18.57 ml/m AORTIC VALVE LVOT Vmax:   66.30 cm/s LVOT Vmean:  44.500 cm/s LVOT VTI:    0.140 m  AORTA Ao Root diam: 3.60 cm Ao Asc diam:  3.80 cm  MITRAL VALVE MV Area (PHT): 3.39 cm    SHUNTS MV Decel Time: 224 msec    Systemic VTI:  0.14 m MR Peak grad: 102.8 mmHg   Systemic Diam: 2.20 cm MR Vmax:      507.00 cm/s MV E velocity: 51.35 cm/s MV A velocity: 81.10 cm/s MV E/A ratio:  0.63  Redell Shallow MD Electronically signed by Redell Shallow MD Signature Date/Time: 07/24/2024/12:50:07 PM    Final        CARDIAC MRI  MR CARDIAC MORPHOLOGY W WO CONTRAST 03/27/2017  Narrative CLINICAL DATA:  Cardiomyopathy of uncertain etiology  EXAM: CARDIAC MRI  TECHNIQUE: The patient was scanned on a 1.5 Tesla GE magnet. A dedicated cardiac coil was used. Functional imaging was done using Fiesta sequences. 2,3, and 4 chamber views were done to assess for RWMA's. Modified Simpson's rule using a short axis stack was used to calculate an ejection fraction on a dedicated work [employer]. The patient received 28 cc of Multihance . After 10 minutes inversion recovery sequences were used to assess for infiltration and scar tissue.  FINDINGS: Limited images of the lung fields showed a moderate right pleural effusion.  Severely dilated left ventricle, normal wall thickness. Severe diffuse hypokinesis, EF 14%. The right ventricle was mildly dilated with mildly decreased systolic function. Moderate left atrial enlargement. Mild right atrial enlargement. Trileaflet aortic  valve with no stenosis or regurgitation. There was moderate mitral regurgitation (central), suspect due to annular dilatation.  On delayed enhancement imaging, there was an area of mid-wall late gadolinium enhancement (LGE) in the basal inferolateral wall. There was a discrete are of full thickness to subepicardial LGE in the mid inferior wall. There  was a small area of subepicardial insertion site LGE in the mid inferoseptal wall. There was patchy mid-wall LGE at the apex.  MEASUREMENTS: MEASUREMENTS LVEDV 439 mL  LVSV 63 mL  LVEF 14%  IMPRESSION: 1. Severely dilated left ventricle with severe diffuse hypokinesis, EF 14%.  2.  Mildly dilated RV with mildly decreased systolic function.  3.  Moderate central mitral regurgitation, likely secondary.  4. The LGE pattern appears to be non-coronary, possibly related to prior myocarditis.  5. There is one area at the apex that I questioned for LV thrombus based on the LGE images. However, on review with colleagues and review of the contrast-enhanced echo, I do not think that thrombus is present.  Dalton Mclean   Electronically Signed By: Ezra Shuck M.D. On: 03/28/2017 07:32   ______________________________________________________________________________________________       Current Reported Medications:.    No outpatient medications have been marked as taking for the 08/28/24 encounter (Appointment) with Lyvonne Cassell D, NP.    Physical Exam:    VS:  There were no vitals taken for this visit.   Wt Readings from Last 3 Encounters:  05/08/24 225 lb (102.1 kg)  10/10/23 234 lb 6.4 oz (106.3 kg)  03/13/23 245 lb (111.1 kg)    GEN: Well nourished, well developed in no acute distress NECK: No JVD; No carotid bruits CARDIAC: ***RRR, no murmurs, rubs, gallops RESPIRATORY:  Clear to auscultation without rales, wheezing or rhonchi  ABDOMEN: Soft, non-tender, non-distended EXTREMITIES:  No edema; No acute deformity      Asessement and Plan:.     ***     Disposition: F/u with ***  Signed, Elener Custodio D Kachina Niederer, NP

## 2024-08-28 ENCOUNTER — Encounter: Payer: Self-pay | Admitting: Cardiology

## 2024-08-28 ENCOUNTER — Ambulatory Visit: Attending: Internal Medicine | Admitting: Cardiology

## 2024-08-28 VITALS — BP 102/60 | HR 82 | Ht 72.0 in | Wt 216.0 lb

## 2024-08-28 DIAGNOSIS — G4733 Obstructive sleep apnea (adult) (pediatric): Secondary | ICD-10-CM | POA: Diagnosis not present

## 2024-08-28 DIAGNOSIS — N184 Chronic kidney disease, stage 4 (severe): Secondary | ICD-10-CM

## 2024-08-28 DIAGNOSIS — I10 Essential (primary) hypertension: Secondary | ICD-10-CM

## 2024-08-28 DIAGNOSIS — I340 Nonrheumatic mitral (valve) insufficiency: Secondary | ICD-10-CM

## 2024-08-28 DIAGNOSIS — I341 Nonrheumatic mitral (valve) prolapse: Secondary | ICD-10-CM

## 2024-08-28 NOTE — Patient Instructions (Signed)
 Medication Instructions:  Your physician recommends that you continue on your current medications as directed. Please refer to the Current Medication list given to you today.  *If you need a refill on your cardiac medications before your next appointment, please call your pharmacy*  Testing/Procedures: Your physician has requested that you have an echocardiogram. Echocardiography is a painless test that uses sound waves to create images of your heart. It provides your doctor with information about the size and shape of your heart and how well your heart's chambers and valves are working. This procedure takes approximately one hour. There are no restrictions for this procedure. Please do NOT wear cologne, perfume, aftershave, or lotions (deodorant is allowed). Please arrive 15 minutes prior to your appointment time.  Please note: We ask at that you not bring children with you during ultrasound (echo/ vascular) testing. Due to room size and safety concerns, children are not allowed in the ultrasound rooms during exams. Our front office staff cannot provide observation of children in our lobby area while testing is being conducted. An adult accompanying a patient to their appointment will only be allowed in the ultrasound room at the discretion of the ultrasound technician under special circumstances. We apologize for any inconvenience.   Follow-Up: At [facility], you and your health needs are our priority.  As part of our continuing mission to provide you with exceptional heart care, our providers are all part of one team.  This team includes your primary Cardiologist (physician) and Advanced Practice Providers or APPs (Physician Assistants and Nurse Practitioners) who all work together to provide you with the care you need, when you need it.  Your next appointment:   6 month(s)  Provider:   Georganna Archer, MD

## 2024-08-30 ENCOUNTER — Other Ambulatory Visit: Payer: Self-pay | Admitting: Physician Assistant

## 2024-09-03 DIAGNOSIS — E7800 Pure hypercholesterolemia, unspecified: Secondary | ICD-10-CM | POA: Diagnosis not present

## 2024-09-03 DIAGNOSIS — Z8639 Personal history of other endocrine, nutritional and metabolic disease: Secondary | ICD-10-CM | POA: Diagnosis not present

## 2024-09-03 DIAGNOSIS — E118 Type 2 diabetes mellitus with unspecified complications: Secondary | ICD-10-CM | POA: Diagnosis not present

## 2024-09-10 ENCOUNTER — Telehealth: Payer: Self-pay | Admitting: Cardiology

## 2024-09-10 DIAGNOSIS — G4733 Obstructive sleep apnea (adult) (pediatric): Secondary | ICD-10-CM

## 2024-09-10 NOTE — Telephone Encounter (Signed)
 Please let Joshua Raymond know that I spoke with Dr. Shlomo.  He will need a NPSG (in lab sleep study) to see if he qualifies for the inspire device.  If patient agreeable please order NPSG with follow-up with Dr. Shlomo following testing. Thank you!

## 2024-09-13 DIAGNOSIS — E7800 Pure hypercholesterolemia, unspecified: Secondary | ICD-10-CM | POA: Diagnosis not present

## 2024-09-13 DIAGNOSIS — I341 Nonrheumatic mitral (valve) prolapse: Secondary | ICD-10-CM | POA: Diagnosis not present

## 2024-09-13 DIAGNOSIS — E118 Type 2 diabetes mellitus with unspecified complications: Secondary | ICD-10-CM | POA: Diagnosis not present

## 2024-09-13 DIAGNOSIS — G4730 Sleep apnea, unspecified: Secondary | ICD-10-CM | POA: Diagnosis not present

## 2024-09-13 DIAGNOSIS — I5022 Chronic systolic (congestive) heart failure: Secondary | ICD-10-CM | POA: Diagnosis not present

## 2024-09-13 DIAGNOSIS — Z79899 Other long term (current) drug therapy: Secondary | ICD-10-CM | POA: Diagnosis not present

## 2024-09-13 DIAGNOSIS — I129 Hypertensive chronic kidney disease with stage 1 through stage 4 chronic kidney disease, or unspecified chronic kidney disease: Secondary | ICD-10-CM | POA: Diagnosis not present

## 2024-09-13 DIAGNOSIS — Z0000 Encounter for general adult medical examination without abnormal findings: Secondary | ICD-10-CM | POA: Diagnosis not present

## 2024-09-13 DIAGNOSIS — R011 Cardiac murmur, unspecified: Secondary | ICD-10-CM | POA: Diagnosis not present

## 2024-09-13 NOTE — Telephone Encounter (Signed)
 Call to patient to discuss NPSG. No answer, left detailed message per DPR explaining that He will need a NPSG (in lab sleep study) to see if he qualifies for the inspire device. Advised that because it takes awhile to get approval from insurance for this test, I will go ahead and enter order. Asked him to call or send us  a MyChart message if he does not want to proceed with testing.

## 2024-09-14 LAB — LAB REPORT - SCANNED
A1c: 5.5
EGFR: 73

## 2024-09-17 MED ORDER — CARVEDILOL 6.25 MG PO TABS: 6.2500 mg | ORAL_TABLET | Freq: Two times a day (BID) | ORAL | 3 refills | Status: AC

## 2024-10-30 ENCOUNTER — Other Ambulatory Visit: Payer: Self-pay | Admitting: Physician Assistant

## 2024-11-11 ENCOUNTER — Other Ambulatory Visit: Payer: Self-pay

## 2024-11-11 ENCOUNTER — Other Ambulatory Visit (HOSPITAL_COMMUNITY): Payer: Self-pay

## 2024-11-11 MED ORDER — MOUNJARO 10 MG/0.5ML ~~LOC~~ SOAJ
10.0000 mg | SUBCUTANEOUS | 3 refills | Status: AC
  Filled 2024-11-11: qty 2, 28d supply, fill #0
  Filled 2024-12-06: qty 2, 28d supply, fill #1

## 2024-11-22 ENCOUNTER — Other Ambulatory Visit: Payer: Self-pay | Admitting: Cardiology

## 2024-12-07 ENCOUNTER — Other Ambulatory Visit (HOSPITAL_COMMUNITY): Payer: Self-pay

## 2024-12-11 MED ORDER — SACUBITRIL-VALSARTAN 97-103 MG PO TABS: 1.0000 | ORAL_TABLET | Freq: Two times a day (BID) | ORAL | 2 refills | Status: AC

## 2025-01-02 ENCOUNTER — Other Ambulatory Visit (HOSPITAL_COMMUNITY): Payer: Self-pay

## 2025-01-02 MED ORDER — MOUNJARO 10 MG/0.5ML ~~LOC~~ SOAJ
10.0000 mg | SUBCUTANEOUS | 3 refills | Status: AC
  Filled 2025-01-02: qty 2, 28d supply, fill #0

## 2025-09-01 ENCOUNTER — Other Ambulatory Visit (HOSPITAL_COMMUNITY)
